# Patient Record
Sex: Female | Born: 1950 | Race: White | Hispanic: No | Marital: Married | Smoking: Former smoker
Health system: Southern US, Community
[De-identification: ages and names within clinical notes are randomized; demographics above are authoritative.]

## PROBLEM LIST (undated history)

## (undated) DIAGNOSIS — S62109A Fracture of unspecified carpal bone, unspecified wrist, initial encounter for closed fracture: Secondary | ICD-10-CM

## (undated) DIAGNOSIS — J449 Chronic obstructive pulmonary disease, unspecified: Secondary | ICD-10-CM

## (undated) DIAGNOSIS — K219 Gastro-esophageal reflux disease without esophagitis: Secondary | ICD-10-CM

## (undated) DIAGNOSIS — B9681 Helicobacter pylori [H. pylori] as the cause of diseases classified elsewhere: Secondary | ICD-10-CM

## (undated) DIAGNOSIS — K279 Peptic ulcer, site unspecified, unspecified as acute or chronic, without hemorrhage or perforation: Secondary | ICD-10-CM

## (undated) DIAGNOSIS — J309 Allergic rhinitis, unspecified: Secondary | ICD-10-CM

## (undated) DIAGNOSIS — M722 Plantar fascial fibromatosis: Secondary | ICD-10-CM

## (undated) DIAGNOSIS — A630 Anogenital (venereal) warts: Secondary | ICD-10-CM

## (undated) DIAGNOSIS — T7840XA Allergy, unspecified, initial encounter: Secondary | ICD-10-CM

## (undated) DIAGNOSIS — K2950 Unspecified chronic gastritis without bleeding: Secondary | ICD-10-CM

## (undated) DIAGNOSIS — K589 Irritable bowel syndrome without diarrhea: Secondary | ICD-10-CM

## (undated) DIAGNOSIS — K2970 Gastritis, unspecified, without bleeding: Secondary | ICD-10-CM

## (undated) DIAGNOSIS — D126 Benign neoplasm of colon, unspecified: Secondary | ICD-10-CM

## (undated) DIAGNOSIS — A048 Other specified bacterial intestinal infections: Secondary | ICD-10-CM

## (undated) DIAGNOSIS — L309 Dermatitis, unspecified: Secondary | ICD-10-CM

## (undated) DIAGNOSIS — K635 Polyp of colon: Secondary | ICD-10-CM

## (undated) HISTORY — DX: Peptic ulcer, site unspecified, unspecified as acute or chronic, without hemorrhage or perforation: K27.9

## (undated) HISTORY — DX: Gastro-esophageal reflux disease without esophagitis: K21.9

## (undated) HISTORY — PX: ESOPHAGOGASTRODUODENOSCOPY: SHX1529

## (undated) HISTORY — DX: Fracture of unspecified carpal bone, unspecified wrist, initial encounter for closed fracture: S62.109A

## (undated) HISTORY — DX: Benign neoplasm of colon, unspecified: D12.6

## (undated) HISTORY — DX: Dermatitis, unspecified: L30.9

## (undated) HISTORY — DX: Chronic obstructive pulmonary disease, unspecified: J44.9

## (undated) HISTORY — DX: Gastritis, unspecified, without bleeding: K29.70

## (undated) HISTORY — PX: POLYPECTOMY: SHX149

## (undated) HISTORY — DX: Allergy, unspecified, initial encounter: T78.40XA

## (undated) HISTORY — DX: Unspecified chronic gastritis without bleeding: K29.50

## (undated) HISTORY — DX: Anogenital (venereal) warts: A63.0

## (undated) HISTORY — DX: Helicobacter pylori (H. pylori) as the cause of diseases classified elsewhere: B96.81

## (undated) HISTORY — PX: COLONOSCOPY: SHX174

## (undated) HISTORY — DX: Other specified bacterial intestinal infections: A04.8

## (undated) HISTORY — DX: Plantar fascial fibromatosis: M72.2

## (undated) HISTORY — DX: Irritable bowel syndrome, unspecified: K58.9

## (undated) HISTORY — DX: Allergic rhinitis, unspecified: J30.9

## (undated) HISTORY — DX: Polyp of colon: K63.5

---

## 1972-04-09 HISTORY — PX: TOTAL ABDOMINAL HYSTERECTOMY W/ BILATERAL SALPINGOOPHORECTOMY: SHX83

## 1993-04-09 DIAGNOSIS — A630 Anogenital (venereal) warts: Secondary | ICD-10-CM

## 1993-04-09 HISTORY — DX: Anogenital (venereal) warts: A63.0

## 1994-04-09 HISTORY — PX: CERVICAL DISCECTOMY: SHX98

## 2003-02-25 ENCOUNTER — Encounter: Admission: RE | Admit: 2003-02-25 | Discharge: 2003-02-25 | Payer: Self-pay | Admitting: Internal Medicine

## 2003-03-16 ENCOUNTER — Encounter: Payer: Self-pay | Admitting: Internal Medicine

## 2004-02-10 ENCOUNTER — Ambulatory Visit: Payer: Self-pay | Admitting: Internal Medicine

## 2004-04-09 DIAGNOSIS — D126 Benign neoplasm of colon, unspecified: Secondary | ICD-10-CM

## 2004-04-09 HISTORY — DX: Benign neoplasm of colon, unspecified: D12.6

## 2004-04-24 ENCOUNTER — Ambulatory Visit: Payer: Self-pay | Admitting: Internal Medicine

## 2004-05-08 ENCOUNTER — Ambulatory Visit: Payer: Self-pay | Admitting: Internal Medicine

## 2005-02-12 ENCOUNTER — Ambulatory Visit: Payer: Self-pay | Admitting: Internal Medicine

## 2005-06-04 ENCOUNTER — Encounter: Admission: RE | Admit: 2005-06-04 | Discharge: 2005-06-04 | Payer: Self-pay | Admitting: Internal Medicine

## 2005-07-30 ENCOUNTER — Ambulatory Visit: Payer: Self-pay | Admitting: Internal Medicine

## 2006-01-04 ENCOUNTER — Other Ambulatory Visit: Admission: RE | Admit: 2006-01-04 | Discharge: 2006-01-04 | Payer: Self-pay | Admitting: Obstetrics & Gynecology

## 2006-06-10 ENCOUNTER — Encounter: Admission: RE | Admit: 2006-06-10 | Discharge: 2006-06-10 | Payer: Self-pay | Admitting: Internal Medicine

## 2006-10-28 ENCOUNTER — Ambulatory Visit: Payer: Self-pay | Admitting: Internal Medicine

## 2007-02-12 ENCOUNTER — Ambulatory Visit: Payer: Self-pay | Admitting: Internal Medicine

## 2007-02-12 DIAGNOSIS — K219 Gastro-esophageal reflux disease without esophagitis: Secondary | ICD-10-CM | POA: Insufficient documentation

## 2007-02-12 DIAGNOSIS — J309 Allergic rhinitis, unspecified: Secondary | ICD-10-CM | POA: Insufficient documentation

## 2007-02-12 DIAGNOSIS — K589 Irritable bowel syndrome without diarrhea: Secondary | ICD-10-CM | POA: Insufficient documentation

## 2007-02-12 DIAGNOSIS — E8941 Symptomatic postprocedural ovarian failure: Secondary | ICD-10-CM | POA: Insufficient documentation

## 2007-11-24 ENCOUNTER — Ambulatory Visit: Payer: Self-pay | Admitting: Internal Medicine

## 2007-12-08 ENCOUNTER — Encounter: Payer: Self-pay | Admitting: Internal Medicine

## 2007-12-08 ENCOUNTER — Ambulatory Visit: Payer: Self-pay | Admitting: Internal Medicine

## 2007-12-11 ENCOUNTER — Encounter: Payer: Self-pay | Admitting: Internal Medicine

## 2008-10-06 ENCOUNTER — Encounter: Admission: RE | Admit: 2008-10-06 | Discharge: 2008-10-06 | Payer: Self-pay | Admitting: Internal Medicine

## 2008-11-15 ENCOUNTER — Encounter (INDEPENDENT_AMBULATORY_CARE_PROVIDER_SITE_OTHER): Payer: Self-pay | Admitting: *Deleted

## 2009-11-01 ENCOUNTER — Encounter (INDEPENDENT_AMBULATORY_CARE_PROVIDER_SITE_OTHER): Payer: Self-pay | Admitting: *Deleted

## 2009-12-08 LAB — HM PAP SMEAR: HM Pap smear: NEGATIVE

## 2009-12-08 LAB — HM DEXA SCAN

## 2009-12-20 ENCOUNTER — Encounter: Admission: RE | Admit: 2009-12-20 | Discharge: 2009-12-20 | Payer: Self-pay | Admitting: Obstetrics & Gynecology

## 2009-12-26 ENCOUNTER — Encounter (INDEPENDENT_AMBULATORY_CARE_PROVIDER_SITE_OTHER): Payer: Self-pay | Admitting: *Deleted

## 2009-12-26 ENCOUNTER — Ambulatory Visit: Payer: Self-pay | Admitting: Internal Medicine

## 2009-12-26 DIAGNOSIS — Z860100 Personal history of colon polyps, unspecified: Secondary | ICD-10-CM | POA: Insufficient documentation

## 2009-12-26 DIAGNOSIS — R1013 Epigastric pain: Secondary | ICD-10-CM | POA: Insufficient documentation

## 2009-12-26 DIAGNOSIS — Z8719 Personal history of other diseases of the digestive system: Secondary | ICD-10-CM | POA: Insufficient documentation

## 2009-12-26 HISTORY — DX: Personal history of colon polyps, unspecified: Z86.0100

## 2009-12-26 HISTORY — DX: Personal history of colonic polyps: Z86.010

## 2010-01-09 ENCOUNTER — Ambulatory Visit: Payer: Self-pay | Admitting: Internal Medicine

## 2010-01-12 ENCOUNTER — Encounter: Payer: Self-pay | Admitting: Internal Medicine

## 2010-02-09 ENCOUNTER — Encounter (INDEPENDENT_AMBULATORY_CARE_PROVIDER_SITE_OTHER): Payer: Self-pay | Admitting: *Deleted

## 2010-04-24 ENCOUNTER — Telehealth: Payer: Self-pay | Admitting: Internal Medicine

## 2010-05-07 LAB — CONVERTED CEMR LAB
ALT: 14 units/L (ref 0–35)
AST: 18 units/L (ref 0–37)
Albumin: 3.7 g/dL (ref 3.5–5.2)
Alkaline Phosphatase: 69 units/L (ref 39–117)
BUN: 12 mg/dL (ref 6–23)
Basophils Absolute: 0.1 10*3/uL (ref 0.0–0.1)
Basophils Relative: 0.8 % (ref 0.0–1.0)
Bilirubin, Direct: 0.1 mg/dL (ref 0.0–0.3)
CO2: 30 meq/L (ref 19–32)
Calcium: 9.3 mg/dL (ref 8.4–10.5)
Chloride: 104 meq/L (ref 96–112)
Cholesterol: 202 mg/dL (ref 0–200)
Creatinine, Ser: 0.7 mg/dL (ref 0.4–1.2)
Direct LDL: 115.5 mg/dL
Eosinophils Absolute: 0.2 10*3/uL (ref 0.0–0.6)
Eosinophils Relative: 2.1 % (ref 0.0–5.0)
GFR calc Af Amer: 112 mL/min
GFR calc non Af Amer: 92 mL/min
Glucose, Bld: 91 mg/dL (ref 70–99)
HCT: 43.9 % (ref 36.0–46.0)
HDL: 60.7 mg/dL (ref >39.0)
Hemoglobin: 15 g/dL (ref 12.0–15.0)
Lymphocytes Relative: 29.3 % (ref 12.0–46.0)
MCHC: 34.1 g/dL (ref 30.0–36.0)
MCV: 97.8 fL (ref 78.0–100.0)
Monocytes Absolute: 0.6 10*3/uL (ref 0.2–0.7)
Monocytes Relative: 6.4 % (ref 3.0–11.0)
Neutro Abs: 5.2 10*3/uL (ref 1.4–7.7)
Neutrophils Relative %: 61.4 % (ref 43.0–77.0)
Pap Smear: NORMAL
Platelets: 216 10*3/uL (ref 150–400)
Potassium: 3.8 meq/L (ref 3.5–5.1)
RBC: 4.49 M/uL (ref 3.87–5.11)
RDW: 12.7 % (ref 11.5–14.6)
Sodium: 141 meq/L (ref 135–145)
TSH: 2.02 microintl units/mL (ref 0.35–5.50)
Total Bilirubin: 0.8 mg/dL (ref 0.3–1.2)
Total CHOL/HDL Ratio: 3.3
Total Protein: 7.4 g/dL (ref 6.0–8.3)
Triglycerides: 79 mg/dL (ref 0–149)
VLDL: 16 mg/dL (ref 0–40)
WBC: 8.6 10*3/uL (ref 4.5–10.5)

## 2010-05-09 NOTE — Miscellaneous (Signed)
Summary: GI PV  Clinical Lists Changes  Medications: Added new medication of DULCOLAX 5 MG  TBEC (BISACODYL) Day before procedure take 2 at 3pm and 2 at 8pm. - Signed Added new medication of METOCLOPRAMIDE HCL 10 MG  TABS (METOCLOPRAMIDE HCL) As per prep instructions. - Signed Added new medication of MIRALAX   POWD (POLYETHYLENE GLYCOL 3350) As per prep  instructions. - Signed Rx of DULCOLAX 5 MG  TBEC (BISACODYL) Day before procedure take 2 at 3pm and 2 at 8pm.;  #4 x 0;  Signed;  Entered by: Barton Fanny RN;  Authorized by: Iva Boop MD;  Method used: Electronic Rx of METOCLOPRAMIDE HCL 10 MG  TABS (METOCLOPRAMIDE HCL) As per prep instructions.;  #2 x 0;  Signed;  Entered by: Barton Fanny RN;  Authorized by: Iva Boop MD;  Method used: Electronic Rx of MIRALAX   POWD (POLYETHYLENE GLYCOL 3350) As per prep  instructions.;  #255gm x 0;  Signed;  Entered by: Barton Fanny RN;  Authorized by: Iva Boop MD;  Method used: Electronic Observations: Added new observation of NKA: T (11/24/2007 9:51)    Prescriptions: MIRALAX   POWD (POLYETHYLENE GLYCOL 3350) As per prep  instructions.  #255gm x 0   Entered by:   Barton Fanny RN   Authorized by:   Iva Boop MD   Signed by:   Barton Fanny RN on 11/24/2007   Method used:   Electronically sent to ...       [facility]  [address] [id number]       [address]       [place], [place]  [address]       Ph: [phone] or [phone]       Fax: [phone]   RxID:   [id number] METOCLOPRAMIDE HCL 10 MG  TABS (METOCLOPRAMIDE HCL) As per prep instructions.  #2 x 0   Entered by:   Barton Fanny RN   Authorized by:   Iva Boop MD   Signed by:   Barton Fanny RN on 11/24/2007   Method used:   Electronically sent to ...       [facility]  [address]*       [address]       [place], [place]  [address]       Ph: [phone] or [phone]       Fax: [phone]  RxID:   [id number] DULCOLAX 5 MG  TBEC (BISACODYL) Day before procedure take 2 at 3pm and 2 at 8pm.  #4 x 0   Entered by:   Barton Fanny RN   Authorized by:   Iva Boop MD   Signed by:   Barton Fanny RN on 11/24/2007   Method used:   Electronically sent to ...       [facility]  [address]       [address]       [place], [place]  [address]       Ph: [phone] or [phone]       Fax: [phone]   RxID:   [id number]

## 2010-05-09 NOTE — Letter (Signed)
Summary: Patient Notice- Polyp Results  [facility]  [address] [place], [place] [address]   Phone: [phone]  Fax: [phone]        December 11, 2007 MRN: [id number]    Jacqueline Boone [address] [place], [place]  [address]    Dear [name],  I am pleased to inform you that the colon polyps removed and/or biopsied during your recent colonoscopy were found to be benign (no cancer detected) upon pathologic examination.  I recommend you have a repeat colonoscopy examination in 1 year to look for recurrent polyps, and to reevaluate the polyps that were biopsied because they were too flat to remove. These polyps were a type known as hyperplastic, which typically do not have potential to develop into cancer over the years. It may be that these polyps were related to the scope sliding over your bowel and were essentially a temporary situation.  Should you develop new or worsening symptoms of abdominal pain, bowel habit changes or bleeding from the rectum or bowels, please schedule an evaluation with either your primary care physician or with me.  Please call us if you are having persistent problems or have questions about your condition that have not been fully answered at this time.  Sincerely,  Iva Boop MD  This letter has been electronically signed by your physician.

## 2010-05-09 NOTE — Procedures (Signed)
Summary: Upper Endoscopy  Patient: Jacqueline Boone Note: All result statuses are Final unless otherwise noted.  Tests: (1) Upper Endoscopy (EGD)   EGD Upper Endoscopy       DONE     [facility]     [address]     [place], [place]  [address]           ENDOSCOPY PROCEDURE REPORT           PATIENT:  [name], [name]  MR#:  [id number]     BIRTHDATE:  July 28, 1950, 58 yrs. old  GENDER:  female           ENDOSCOPIST:  Iva Boop, MD, [facility]           PROCEDURE DATE:  01/09/2010     PROCEDURE:  EGD with biopsy     ASA CLASS:  Class I     INDICATIONS:  epigastric pain           MEDICATIONS:   Fentanyl 75 mcg IV, Versed 7 mg IV     TOPICAL ANESTHETIC:  Exactacain Spray           DESCRIPTION OF PROCEDURE:   After the risks benefits and     alternatives of the procedure were thoroughly explained, informed     consent was obtained.  The [facility] [id number] endoscope was     introduced through the mouth and advanced to the second portion of     the duodenum, without limitations.  The instrument was slowly     withdrawn as the mucosa was fully examined.     <<PROCEDUREIMAGES>>           Multiple erosions were found in the antrum. Multiple biopsies were     obtained and sent to pathology.  Multiple erosions were found in     the bulb of the duodenum.  Moderate gastritis was found in the     body and the antrum of the stomach.  Otherwise the examination was     normal.    Retroflexed views revealed no abnormalities.    The     scope was then withdrawn from the patient and the procedure     completed.           COMPLICATIONS:  None           ENDOSCOPIC IMPRESSION:     1) Erosions, multiple in the antrum     2) Erosions, multiple in the bulb of duodenum     3) Moderate gastritis in the body and the antrum of the stomach           4) Otherwise normal examination     RECOMMENDATIONS:     Start omeprazole 40 mg each AM. Do not stop unless instructed by     health care  provider.     Await biopsies - will notify           Iva Boop, MD, [facility]           CC:  Willow Ora, MD     The Patient           n.     eSIGNED:   Iva Boop at 01/09/2010 04:03 PM           Lorel, [place], [id number]  Note: An exclamation mark (!) indicates a result that was not dispersed into the flowsheet. Document Creation Date: 01/09/2010 4:03 PM _______________________________________________________________________  (1) Order result status: Final  Collection or observation date-time: 01/09/2010 15:19 Requested date-time:  Receipt date-time:  Reported date-time:  Referring Physician:   Ordering Physician: Stan Head [phone]) Specimen Source:  Source: Launa Grill Order Number: [id number] Lab site:

## 2010-05-09 NOTE — Letter (Signed)
Summary: New Patient letter  [facility]  [address] [place], [place] [address]   Phone: [phone]  Fax: [phone]       11/01/2009 MRN: [id number]  Jacqueline Boone [address] [place], [place]  [address]  Dear Jacqueline Boone,  Welcome to the Gastroenterology Division at [facility].    You are scheduled to see Dr. Leone Payor on 9/19/2011at 8:45AM on the 3rd floor at [facility], [address]  We ask that you try to arrive at our office 15 minutes prior to your appointment time to allow for check-in.  We would like you to complete the enclosed self-administered evaluation form prior to your visit and bring it with you on the day of your appointment.  We will review it with you.  Also, please bring a complete list of all your medications or, if you prefer, bring the medication bottles and we will list them.  Please bring your insurance card so that we may make a copy of it.  If your insurance requires a referral to see a specialist, please bring your referral form from your primary care physician.  Co-payments are due at the time of your visit and may be paid by cash, check or credit card.     Your office visit will consist of a consult with your physician (includes a physical exam), any laboratory testing he/she may order, scheduling of any necessary diagnostic testing (e.g. x-ray, ultrasound, CT-scan), and scheduling of a procedure (e.g. Endoscopy, Colonoscopy) if required.  Please allow enough time on your schedule to allow for any/all of these possibilities.    If you cannot keep your appointment, please call [phone] to cancel or reschedule prior to your appointment date.  This allows [place] the opportunity to schedule an appointment for another patient in need of care.  If you do not cancel or reschedule by 5 p.m. the business day prior to your appointment date, you will be charged a $50.00 late cancellation/no-show fee.    Thank you for choosing  [facility] for your medical needs.  We appreciate the opportunity to care for you.  Please visit [place] at our website  to learn more about our practice.                     Sincerely,                                                             The Gastroenterology Division

## 2010-05-09 NOTE — Procedures (Signed)
Summary: Colonoscopy  Patient: Jacqueline Boone Note: All result statuses are Final unless otherwise noted.  Tests: (1) Colonoscopy (COL)   COL Colonoscopy           DONE     [facility]     [address]     [place], [place]  [address]           COLONOSCOPY PROCEDURE REPORT           PATIENT:  Jacqueline Boone, Jacqueline Boone  MR#:  [id number]     BIRTHDATE:  11-Jan-1951, 58 yrs. old  GENDER:  female     ENDOSCOPIST:  Iva Boop, MD, [facility]           PROCEDURE DATE:  01/09/2010     PROCEDURE:  Colonoscopy with snare polypectomy     ASA CLASS:  Class I     INDICATIONS:  surveillance and high-risk screening, history of     pre-cancerous (adenomatous) colon polyps She has had a mixture of     hyperplastic and adenomatous polyps, in 2005 and then 2009. For     follow-up as not all removed in 2009 (technically unable).     MEDICATIONS:   Fentanyl 25 mcg IV, Versed 3 mg IV, There was     residual sedation effect present from prior procedure.           DESCRIPTION OF PROCEDURE:   After the risks benefits and     alternatives of the procedure were thoroughly explained, informed     consent was obtained.  Digital rectal exam was performed and     revealed no abnormalities.   The LB [id number] endoscope     was introduced through the anus and advanced to the cecum, which     was identified by both the appendix and ileocecal valve, without     limitations.  The quality of the prep was excellent, using     MoviPrep.  The instrument was then slowly withdrawn as the colon     was fully examined. Insertion: 2:30 minutes Withdrawal: 28:50     minutes.     <<PROCEDUREIMAGES>>           FINDINGS:  Five polyps were found. Proximal splenic flexure,     distal splenic flexure (5-6 mm polyp in region of larger polyp     partially removed/biopsied 2009), descending, sigmoid and rectum.     Minimum size 6 mm, maximum 1 cm. All sessile. Some polyps were     snared without cautery and some  were snared, then cauterized with     monopolar cautery. Retrieval was successful. This was otherwise a     normal examination of the colon.   Retroflexed views in the right     colon and rectum revealed no abnormalities.    The scope was then     withdrawn from the patient and the procedure completed.           COMPLICATIONS:  None     ENDOSCOPIC IMPRESSION:     1) Five polyps removed, largest 1 cm and smallest 5-6 mm. all     flat, sessile polyps in left colon/rectum.     2) Otherwise normal examination     RECOMMENDATIONS:     1) No aspirin or NSAID's for 2 weeks           REPEAT EXAM:  In for Colonoscopy, pending biopsy results.  Iva Boop, MD, [facility]           CC:  The Patient     Willow Ora, MD           n.     eSIGNED:   Iva Boop at 01/09/2010 04:13 PM           T[place], Erich[place], [id number]  Note: An exclamation mark (!) indicates a result that was not dispersed into the flowsheet. Document Creation Date: 01/09/2010 4:13 PM _______________________________________________________________________  (1) Order result status: Final Collection or observation date-time: 01/09/2010 15:55 Requested date-time:  Receipt date-time:  Reported date-time:  Referring Physician:   Ordering Physician: Stan Head [phone]) Specimen Source:  Source: Launa Grill Order Number: [id number] Lab site:   Appended Document: Colonoscopy     Procedures Next Due Date:    Colonoscopy: 01/2013

## 2010-05-09 NOTE — Miscellaneous (Signed)
Summary: Omeprazole Rx  Clinical Lists Changes  Medications: Removed medication of MOVIPREP 100 GM  SOLR (PEG-KCL-NACL-NASULF-NA ASC-C) As per prep instructions. Added new medication of OMEPRAZOLE 40 MG CPDR (OMEPRAZOLE) 1 by mouth once daily 30-60 minutes before breakfast - Signed Rx of OMEPRAZOLE 40 MG CPDR (OMEPRAZOLE) 1 by mouth once daily 30-60 minutes before breakfast;  #30 x 11;  Signed;  Entered by: Iva Boop MD, [facility];  Authorized by: Iva Boop MD, FACG;  Method used: Electronically to [facility] # [id number]*, [address] [place], [place], [place]  [address], Ph: [phone] or [phone], Fax: [phone]    Prescriptions: OMEPRAZOLE 40 MG CPDR (OMEPRAZOLE) 1 by mouth once daily 30-60 minutes before breakfast  #30 x 11   Entered and Authorized by:   Iva Boop MD, [facility]   Signed by:   Iva Boop MD, [facility] on 01/09/2010   Method used:   Electronically to        [facility]  [address]* (retail)       [address]       [place], [place]  [address]       Ph: [phone] or [phone]       Fax: [phone]   RxID:   [id number]

## 2010-05-09 NOTE — Letter (Signed)
Summary: Patient Notice- Polyp Results  [facility]  [address] [place], [place] [address]   Phone: [phone]  Fax: [phone]        January 12, 2010 MRN: [id number]    Jacqueline Boone [address] [place], [place]  [address]    Dear [name],  Some of the polyps removed from your colon were adenomatous. This means that they were pre-cancerous or that  they had the potential to change into cancer over time.   I recommend that you have a repeat colonoscopy in 3 years to determine if you have developed any new polyps over time and to screen for colorectal cancer. If you develop any new rectal bleeding, abdominal pain or significant bowel habit changes, please contact us before then.  The stomach biopsies showed non-specific inflammation and I am hopeful that the omeprazole will treat that and your symptoms effectively. Some of it may have been due to ibuprofen use also.  Please call us if you are having persistent problems or have questions about your condition that have not been fully answered at this time.   Sincerely,  Iva Boop MD, [facility]  This letter has been electronically signed by your physician.  Appended Document: Patient Notice- Polyp Results letter mailed

## 2010-05-09 NOTE — Assessment & Plan Note (Signed)
Summary: ABD PAIN,POSS ULCER...AS.    History of Present Illness Visit Type: Initial Visit Primary GI MD: Stan Head MD [facility] Primary Provider: Willow Ora, MD Chief Complaint: Abdominal pain History of Present Illness:   Pateint states that she has the same symptoms experienced when she had H pylori previously, same area.  Having bloating, dull pain in the epigastric area. Intermittent. when it is there "it is bad and bothers me for days". She believes it has been an intermittent issue since H. pylori gastritis was diagnosed at last EGD. Now worse in frequency and intensity. It does not awaken her.  Constipation with 2 bowel movements a week is not changed.   GI Review of Systems    Reports abdominal pain, acid reflux, belching, and  bloating.     Location of  Abdominal pain: epigastric area.    Denies chest pain, dysphagia with liquids, dysphagia with solids, heartburn, loss of appetite, nausea, vomiting, vomiting blood, weight loss, and  weight gain.      Reports constipation.     Denies anal fissure, black tarry stools, change in bowel habit, diarrhea, diverticulosis, fecal incontinence, heme positive stool, hemorrhoids, irritable bowel syndrome, jaundice, light color stool, liver problems, rectal bleeding, and  rectal pain. Clinical Reports Reviewed:  Colonoscopy:  12/08/2007:  Pathology:  Adenomatous polyp x 1 Pathology:  Hyperplastic polyp x 3 Location:  [facility].   Comments: 1) FOUR POLYPS, 2 SMALLER ONES REMOVED, LARGER FLATTER POLYPS BIOPSIED ONLY. THESE HAVE APPEARANCE OF HYPERPLASTIC POLYPS BUT AWAIT PATHOLOGY. 2) OTHERWISE NORMAL WITH EXCELLENT PREP. 3) PRIOR LARGE HYPERPLASTIC CECAL POLYP 2005  ***MICROSCOPIC EXAMINATION AND DIAGNOSIS***    1. COLON, SPLENIC FLEXURE POLYP: FRAGMENTS OF HYPERPLASTIC POLYP. NO ADENOMATOUS CHANGE OR MALIGNANCY IDENTIFIED.    2. COLON, SIGMOID POLYP: TUBULAR ADENOMA. NO HIGH GRADE DYSPLASIA OR MALIGNANCY  IDENTIFIED.    3. COLON, SIGMOID POLYP: FRAGMENTS OF HYPERPLASTIC POLYP. NO ADENOMATOUS CHANGE OR MALIGNANCY IDENTIFIED.    4. COLON, RECTAL POLYP: HYPERPLASTIC POLYP. NO ADENOMATOUS CHANGE OR MALIGNANCY IDENTIFIED.  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 -FOCAL ULCERATION ASSOCIATED WITH FIBROSIS AND REGENERATIVE/REPARATIVE EPITHERLIAL CHANGES. -NO INTESTINAL METAPLAISA OR MALIGNANCY IDENTIFIED.   Preventive Screening-Counseling & Management  Alcohol-Tobacco     Smoking Status: quit  Caffeine-Diet-Exercise     Caffeine use/day: 3/day coffeee     Caffeine Counseling: decrease use of caffeine     Does Patient Exercise: yes     Type of exercise: walk     Exercise (avg: min/session): 45     Times/week: 1-2     Exercise Counseling: to improve exercise regimen      Drug Use:  no.      Current Medications (verified): 1)  Premarin 0.625 Mg  Tabs (Estrogens Conjugated) .... Take 1 Tab Daily  Allergies (verified): No Known Drug Allergies  Past History:  Past Medical History: Allergic rhinitis GERD Peptic Ulcer Disease H. pylori gastritis Adenomatous Colon Polyps  Past Surgical History: Reviewed history from 12/23/2009 and no changes required. Hysterectomy 1974 OophorectomyC spine diskektomy , anteriorly, C4-5 (1998)  Family History: Reviewed history from 02/12/2007 and no changes required. Hodgkins lymphoma: GF at 39y/o and father  at 79y/o Breast ca--no MI--no Colon ca--no  Social History: Married no kids Patient is a former smoker.  Alcohol Use - no Daily Caffeine Use Illicit  Drug Use - no Works part-time at [employer] Smoking Status:  quit Caffeine use/day:  3/day coffeee Does Patient Exercise:  yes  Review  of Systems       The patient complains of allergy/sinus, night sweats, and urine leakage.         All other ROS negative except as per HPI.   Vital Signs:  Patient profile:   60 year old female Height:      63.5 inches Weight:      146.50 pounds BMI:     25.64 Pulse rate:   72 / minute Pulse rhythm:   regular BP sitting:   90 / 60  (left arm) Cuff size:   regular  Vitals Entered By: June McMurray CMA Duncan Dull) (December 26, 2009 8:49 AM)  Physical Exam  General:  Well developed, well nourished, no acute distress. Eyes:  PERRLA, no icterus. Mouth:  No deformity or lesions, prior dental work and missing some teeth. Neck:  Supple; no masses or thyromegaly. Lungs:  Clear throughout to auscultation. Heart:  Regular rate and rhythm; no murmurs, rubs,  or bruits. Abdomen:  Soft, nontender and nondistended. No masses, hepatosplenomegaly or hernias noted. Normal bowel sounds. Rectal:  deferred until time of colonoscopy.   Extremities:  No clubbing, cyanosis, edema or deformities noted. Neurologic:  Alert and  oriented x4;  grossly normal neurologically. Cervical Nodes:  No significant cervical or supraclavicular adenopathy.  Inguinal Nodes:  No significant inguinal adenopathy. Psych:  Alert and cooperative. Normal mood and affect.   Impression & Recommendations:  Problem # 1:  ABDOMINAL PAIN-EPIGASTRIC (ICD-789.06) Evaluate this recurrent problem with EGD to look for gastritis, ulcer disease and recurrent H. pylori. She is off PPI til then. Orders: Colon/Endo (Colon/Endo) Risks, benefits,and indications of endoscopic procedure(s) were reviewed with the patient and all questions answered.  Problem # 2:  HELICOBACTER PYLORI GASTRITIS, HX OF (ICD-V12.79) Assessment: Unchanged reassess with EGD and bx  Problem # 3:  COLONIC POLYPS, ADENOMATOUS, HX OF (ICD-V12.72) due for surveillance and screening - I had recommended closer follow-up as she has had numerous hyperplastic polyps as  well as some adenomas Orders: Colon/Endo (Colon/Endo) Risks, benefits,and indications of endoscopic procedure(s) were reviewed with the patient and all questions answered.  Problem # 4:  IRRITABLE BOWEL SYNDROME (ICD-564.1) Assessment: Deteriorated may be the problem  Patient Instructions: 1)  Please pick up your medications at your pharmacy. MOVIPREP 2)  We will see you at your procedure on 01/09/10 3)   Endoscopy Center Patient Information Guide given to patient.  4)  Colonoscopy and Flexible Sigmoidoscopy brochure given.  5)  Upper Endoscopy brochure given.  6)  The medication list was reviewed and reconciled.  All changed / newly prescribed medications were explained.  A complete medication list was provided to the patient / caregiver. Prescriptions: MOVIPREP 100 GM  SOLR (PEG-KCL-NACL-NASULF-NA ASC-C) As per prep instructions.  #1 x 0   Entered by:   Francee Piccolo CMA (AAMA)   Authorized by:   Iva Boop MD, [facility]   Signed by:   Francee Piccolo CMA (AAMA) on 12/26/2009   Method used:   Electronically to        [facility] [address] [facility] (retail)       [address]       [place], [place]  [address]       Ph: [phone] or [phone]       Fax: [phone]  RxID:   [id number]

## 2010-05-09 NOTE — Procedures (Signed)
Summary: Colonoscopy   Colonoscopy  Procedure date:  12/08/2007  Findings:      Location:  [facility].    Procedures Next Due Date:    Colonoscopy: 12/2008  Patient Name: Jacqueline Boone MRN:  Procedure Procedures: Colonoscopy CPT: [phone].    with biopsy. CPT: Q5068410.    with polypectomy. CPT: [id number].    with Submucosal injection  Personnel: Endoscopist: Iva Boop, MD, [facility].  Exam Location: Exam performed in Outpatient Clinic. Outpatient  Patient Consent: Procedure, Alternatives, Risks and Benefits discussed, consent obtained, from patient. Consent was obtained by the RN. Indications  Surveillance of: Adenomatous Polyp(s). This is an initial surveillance exam. Initial polypectomy was performed in 2005. in Jun. 1-2 Polyps were found at Index Exam. Largest polyp removed was 10 to 19 mm. Prior polyp located in proximal (splenic flexure and beyond) colon. Pathology of worst  polyp: hyperplastic.  Comments: Large hyperplastic cecal polyp previously History  Current Medications: Patient is not currently taking Coumadin.  Allergies: No known allergies.  Pre-Exam Physical: Performed Dec 08, 2007. Cardio-pulmonary exam, Rectal exam, HEENT exam , Abdominal exam, Mental status exam WNL.  Comments: Pt. history reviewed/updated, physical exam performed prior to initiation of sedation? yes Exam Exam: Extent of exam reached: Cecum, extent intended: Cecum.  The cecum was identified by appendiceal orifice and IC valve. Patient position: on left side. Time to Cecum: 00:03:27. Time for Withdrawl: 00:30:43. Colon retroflexion performed. Images taken. ASA Classification: I. Tolerance: excellent.  Monitoring: Pulse and BP monitoring, Oximetry used. Supplemental O2 given.  Colon Prep Used MiraLax for colon prep. Prep results: excellent.  Sedation Meds: Patient assessed and found to be appropriate for moderate (conscious) sedation. Fentanyl 50 mcg. given  IV. Versed 3 mg. given IV. Findings POLYP: Splenic Flexure, Maximum size: 20 mm. sessile polyp. Procedure:  snare with cautery, not removed, Path # 1. Comments: Large flat polyp, submucosal saline injection would not persistently elevate it. One small piece snared, and biopsie taken. Site marked with submucosal SPOT in 4 areas proximal and distal to site.  NORMAL EXAM: Descending Colon.  - NORMAL EXAM: Cecum to Splenic Flexure.  POLYP: Sigmoid Colon, Maximum size: 5 mm. sessile polyp. Procedure:  biopsy without cautery, The polyp was removed piece meal. removed, retrieved, Polyp sent to pathology. Path # 2.  POLYP: Sigmoid Colon, Maximum size: 15 mm. sessile polyp. Procedure:  biopsy without cautery, not removed, Path # 3. Comments: Flat, polypoid changes.  POLYP: Rectum, Maximum size: 5 mm. sessile polyp. Procedure:  snare without cautery, removed, retrieved, sent to pathology. Path # 4.   Assessment  Comments: 1) FOUR POLYPS, 2 SMALLER ONES REMOVED, LARGER FLATTER POLYPS BIOPSIED ONLY. THESE HAVE APPEARANCE OF HYPERPLASTIC POLYPS BUT AWAIT PATHOLOGY. 2) OTHERWISE NORMAL WITH EXCELLENT PREP. 3) PRIOR LARGE HYPERPLASTIC CECAL POLYP 2005 Events  Unplanned Interventions: No intervention was required.  Plans Medication Plan: Await pathology.  Disposition: After procedure patient sent to recovery. After recovery patient sent home.  Scheduling/Referral: Await pathology to schedule patient.  Comments: IF ALL HYPERPLASTIC THEN COMPLETE REMOVAL OF RESIDUAL POLYPS MAY NOT BE NECESSARY BUT WILL NEED TO DETERMINE APPROPRATE SURVEILLANCE.    cc.   Jose Paz,MD   REPORT OF SURGICAL PATHOLOGY   Case #: [id number] Patient Name: Jacqueline Boone, Jacqueline Boone. Office Chart Number:  N/A   MRN: [id number] Pathologist: Beulah Gandy. Luisa Hart, MD DOB/Age  [birth year] (Age: 60)    Gender: F Date Taken:  12/08/2007 Date Received: 12/08/2007   FINAL DIAGNOSIS   ***MICROSCOPIC EXAMINATION  AND  DIAGNOSIS***   1.  COLON, SPLENIC FLEXURE POLYP:  FRAGMENTS OF HYPERPLASTIC POLYP.  NO ADENOMATOUS CHANGE OR MALIGNANCY IDENTIFIED.   2.  COLON, SIGMOID POLYP:  TUBULAR ADENOMA.  NO HIGH GRADE DYSPLASIA OR MALIGNANCY IDENTIFIED.   3.  COLON, SIGMOID POLYP:  FRAGMENTS OF HYPERPLASTIC POLYP.  NO ADENOMATOUS CHANGE OR MALIGNANCY IDENTIFIED.   4.  COLON, RECTAL POLYP:  HYPERPLASTIC POLYP.  NO ADENOMATOUS CHANGE OR MALIGNANCY IDENTIFIED.   kv Date Reported:  12/10/2007     Beulah Gandy. Luisa Hart, MD *** Electronically Signed Out By JDP ***   Clinical information HX polyps R/O adenoma (jes)    specimen(s) obtained 1: Colon, polyp(s), splenic flexure 2: Colon, polyp(s), proximal sigmoid 3: Colon, polyp(s), sigmoid 4: Rectum, polyp(s)   Gross Description 1.  Received in formalin are tan, soft tissue fragments that are submitted in toto.  Number:  three Size:  0.3 to 0.5 cm One block    2.  Received in formalin are tan, soft tissue fragments that are submitted in toto.   Number:  two Size:  0.1 to 0.4 cm One block    3.  Received in formalin are tan, soft tissue fragments that are submitted in toto.   Number:  four. Size:  0.2 to 0.3 cm One block    4.  Received in formalin is a tan, soft tissue fragment that is submitted in toto.  Size:  0.5 cm One block (TA:jes,12/09/07)     jes/     Signed by Iva Boop MD on 12/11/2007 at 6:30 AM  ________________________________________________________________________ only 1 small adenoma hyperplastic polyps otherwise recall 1 yr due to multiple left hyperplastic polyps and the adenoma, to see if these were persistent polyps or mucosl changes from scope passage 12/2008   Signed by Iva Boop MD on 12/11/2007 at 6:35 AM  ________________________________________________________________________   December 11, 2007 MRN: [id number]    Jacqueline Boone [address] [place], [place]  [address]    Dear [name],  I am  pleased to inform you that the colon polyps removed and/or biopsied during your recent colonoscopy were found to be benign (no cancer detected) upon pathologic examination.  I recommend you have a repeat colonoscopy examination in 1 year to look for recurrent polyps, and to reevaluate the polyps that were biopsied because they were too flat to remove. These polyps were a type known as hyperplastic, which typically do not have potential to develop into cancer over the years. It may be that these polyps were related to the scope sliding over your bowel and were essentially a temporary situation.  Should you develop new or worsening symptoms of abdominal pain, bowel habit changes or bleeding from the rectum or bowels, please schedule an evaluation with either your primary care physician or with me.  Please call us if you are having persistent problems or have questions about your condition that have not been fully answered at this time.  Sincerely,  Iva Boop MD  This letter has been electronically signed by your physician.   Signed by Iva Boop MD on 12/11/2007 at 6:39 AM  ________________________________________________________________________ ,gisig  Appended Document: Colonoscopy  Colonoscopy  Procedure date:  12/08/2007  Findings:      Pathology:  Adenomatous polyp x 1 Pathology:  Hyperplastic polyp x 3 Location:  [facility].   Comments: 1) FOUR POLYPS, 2 SMALLER ONES REMOVED, LARGER FLATTER POLYPS BIOPSIED ONLY. THESE HAVE APPEARANCE OF HYPERPLASTIC POLYPS BUT AWAIT  PATHOLOGY. 2) OTHERWISE NORMAL WITH EXCELLENT PREP. 3) PRIOR LARGE HYPERPLASTIC CECAL POLYP 2005  ***MICROSCOPIC EXAMINATION AND DIAGNOSIS***    1. COLON, SPLENIC FLEXURE POLYP: FRAGMENTS OF HYPERPLASTIC POLYP. NO ADENOMATOUS CHANGE OR MALIGNANCY IDENTIFIED.    2. COLON, SIGMOID POLYP: TUBULAR ADENOMA. NO HIGH GRADE DYSPLASIA OR MALIGNANCY IDENTIFIED.    3. COLON, SIGMOID POLYP:  FRAGMENTS OF HYPERPLASTIC POLYP. NO ADENOMATOUS CHANGE OR MALIGNANCY IDENTIFIED.    4. COLON, RECTAL POLYP: HYPERPLASTIC POLYP. NO ADENOMATOUS CHANGE OR MALIGNANCY IDENTIFIED.   Procedures Next Due Date:    Colonoscopy: 12/2008

## 2010-05-09 NOTE — Letter (Signed)
Summary: Recall Colonoscopy Letter  [facility]  [address] [place], [place] [address]   Phone: [phone]  Fax: [phone]      November 15, 2008 MRN: [id number]   Jacqueline Boone [address] [place], [place]  [address]   Dear [name],   According to your medical record, it is time for you to schedule a Colonoscopy. The American Cancer Society recommends this procedure as a method to detect early colon cancer. Patients with a family history of colon cancer, or a personal history of colon polyps or inflammatory bowel disease are at increased risk.  This letter has beeen generated based on the recommendations made at the time of your procedure. If you feel that in your particular situation this may no longer apply, please contact our office.  Please call our office at [phone] to schedule this appointment or to update your records at your earliest convenience.  Thank you for cooperating with [place] to provide you with the very best care possible.   Sincerely,  Iva Boop, M.D.  [facility] [phone]

## 2010-05-09 NOTE — Miscellaneous (Signed)
Summary: got @ [facility]   Immunization History:  Influenza Immunization History:    Influenza:  got @ [facility]  (02/06/2010)

## 2010-05-09 NOTE — Procedures (Signed)
Summary: EGD: Gastritis, Duodenitis, Ulcer, RUT +   EGD  Procedure date:  03/16/2003  Findings:      Findings: Duodenitis  Findings: Gastritis  Findings: Ulcer -- RUT positive Location: [facility]   ***MICROSCOPIC EXAMINATION AND DIAGNOSIS*** STOMACH, BIOPSY:  -CHRONIC ACTIVE GASTRITIS ASSOCIATED WITH NUMEROUS HELICOBACTER PYLORI ORGANISMS. -FOCAL ULCERATION ASSOCIATED WITH FIBROSIS AND REGENERATIVE/REPARATIVE EPITHERLIAL CHANGES. -NO INTESTINAL METAPLAISA OR MALIGNANCY IDENTIFIED.  Patient Name: Jacqueline Boone, Jacqueline Boone MRN:  Procedure Procedures: Panendoscopy (EGD) CPT: 43235.    with biopsy(s)/brushing(s). CPT: [id number].  Personnel: Endoscopist: Iva Boop, MD, [facility].  Exam Location: Exam performed in Outpatient Clinic. Outpatient  Patient Consent: Procedure, Alternatives, Risks and Benefits discussed, consent obtained, from patient. Consent was obtained by the RN.  Indications Symptoms: Dyspepsia,  History  Current Medications: Patient is not currently taking Coumadin.  Pre-Exam Physical: Performed Mar 16, 2003  Cardio-pulmonary exam, HEENT exam, Abdominal exam, Mental status exam WNL.  Exam Exam Info: Maximum depth of insertion Duodenum, intended Duodenum. Patient position: on left side. Vocal cords not visualized. Gastric retroflexion performed. Images taken. ASA Classification: I. Tolerance: excellent.  Sedation Meds: Patient assessed and found to be appropriate for moderate (conscious) sedation. Sedation was managed by the Endoscopist. Fentanyl 50 mcg. given IV. Versed 5 mg. given IV. Cetacaine Spray 2 sprays given aerosolized.  Monitoring: BP and pulse monitoring done. Oximetry used. Supplemental O2 given  Findings Normal: Proximal Esophagus to Distal Esophagus.  MUCOSAL ABNORMALITY: Fundus to Body. Mosaic/scaly mucosa. Red spots present. ICD9: Gastritis, Chronic: 535.10.  MUCOSAL ABNORMALITY: Antrum. Erosions present. Erythematous  mucosa. Edema present. ICD9: Gastritis without Hemorrhage: 535.50.  MUCOSAL ABNORMALITY: Duodenal Bulb. Erythematous mucosa. Edema present. RUT done, results pending. ICD9: Duodenitis without Hemorrhage: 535.60.  ULCER: in Antrum Minimum Size: 2 mm. Maximum size: 6 mm. Not bleeding, clear ulcer base. An image was taken. Biopsy/Ulcer taken. RUT done, results pending.  ICD9: Ulcer, Gastric, Acute without Hemorrhage: 531.30.  Normal: Duodenal Apex to Duodenal 2nd Portion.   Assessment Abnormal examination, see findings above.  Diagnoses: 535.10: Gastritis, Chronic.  531.30: Ulcer, Gastric, Acute without Hemorrhage.  535.50: Gastritis without Hemorrhage.  535.60: Duodenitis without Hemorrhage.   Events  Unplanned Intervention: No unplanned interventions were required.  Plans Comments: Treat H. pylori if positive. Start PPI (Nexium 40 mg each day). Disposition: After procedure patient sent to recovery. After recovery patient sent home.  Scheduling: Await pathology to schedule patient.  Comments: Will notify need to treat H. pylori. Did not qualify for dyspepsia study. May need follow-up visit with me in office.  This report was created from the original endoscopy report, which was reviewed and signed by the above listed endoscopist.

## 2010-05-09 NOTE — Letter (Signed)
Summary: [facility] Instructions  [facility]  [address] [place], [place] [address]   Phone: [phone]  Fax: [phone]       Jacqueline Boone    [birth year]    MRN: [id number]      Procedure Day Dorna Bloom: Duanne Limerick, 01/09/10     Arrival Time: 1:30 PM      Procedure Time: 2:30 PM    Location of Procedure:                    _X_  [facility] (4th Floor)  PREPARATION FOR COLONOSCOPY WITH MOVIPREP   Starting 5 days prior to your procedure 01/05/10 do not eat nuts, seeds, popcorn, corn, beans, peas,  salads, or any raw vegetables.  Do not take any fiber supplements (e.g. Metamucil, Citrucel, and Benefiber).  THE DAY BEFORE YOUR PROCEDURE         SUNDAY, 01/08/10  1.  Drink clear liquids the entire day-NO SOLID FOOD  2.  Do not drink anything colored red or purple.  Avoid juices with pulp.  No orange juice.  3.  Drink at least 64 oz. (8 glasses) of fluid/clear liquids during the day to prevent dehydration and help the prep work efficiently.  CLEAR LIQUIDS INCLUDE: Water Jello Ice Popsicles Tea (sugar ok, no milk/cream) Powdered fruit flavored drinks Coffee (sugar ok, no milk/cream) Gatorade Juice: apple, white grape, white cranberry  Lemonade Clear bullion, consomm, broth Carbonated beverages (any kind) Strained chicken noodle soup Hard Candy                           4.  In the morning, mix first dose of MoviPrep solution:    Empty 1 Pouch A and 1 Pouch B into the disposable container    Add lukewarm drinking water to the top line of the container. Mix to dissolve    Refrigerate (mixed solution should be used within 24 hrs)  5.  Begin drinking the prep at 5:00 p.m. The MoviPrep container is divided by 4 marks.   Every 15 minutes drink the solution down to the next mark (approximately 8 oz) until the full liter is complete.   6.  Follow completed prep with 16 oz of clear liquid of your choice (Nothing red or purple).  Continue to drink clear  liquids until bedtime.  7.  Before going to bed, mix second dose of MoviPrep solution:    Empty 1 Pouch A and 1 Pouch B into the disposable container    Add lukewarm drinking water to the top line of the container. Mix to dissolve    Refrigerate  THE DAY OF YOUR PROCEDURE      MONDAY, 01/09/10  Beginning at 9:30 a.m. (5 hours before procedure):         1. Every 15 minutes, drink the solution down to the next mark (approx 8 oz) until the full liter is complete.  2. Follow completed prep with 16 oz. of clear liquid of your choice.    3. You may drink clear liquids until 12:30 PM (2 HOURS BEFORE PROCEDURE).  MEDICATION INSTRUCTIONS  Unless otherwise instructed, you should take regular prescription medications with a small sip of water   as early as possible the morning of your procedure.       OTHER INSTRUCTIONS  You will need a responsible adult at least 60 years of age to accompany you and drive you home.   This  person must remain in the waiting room during your procedure.  Wear loose fitting clothing that is easily removed.  Leave jewelry and other valuables at home.  However, you may wish to bring a book to read or  an iPod/MP3 player to listen to music as you wait for your procedure to start.  Remove all body piercing jewelry and leave at home.  Total time from sign-in until discharge is approximately 2-3 hours.  You should go home directly after your procedure and rest.  You can resume normal activities the  day after your procedure.  The day of your procedure you should not:   Drive   Make legal decisions   Operate machinery   Drink alcohol   Return to work  You will receive specific instructions about eating, activities and medications before you leave.   The above instructions have been reviewed and explained to me by   _______________________   I fully understand and can verbalize these instructions _____________________________ Date _________

## 2010-05-11 NOTE — Progress Notes (Signed)
Summary: Triage   Phone Note Call from Patient Call back at Home Phone [phone]   Caller: Patient Call For: Dr. Leone Payor Reason for Call: Talk to Nurse Summary of Call: Omeprazole is causing her to have a headache, congested Initial call taken by: Karna Christmas,  April 24, 2010 1:54 PM  Follow-up for Phone Call        Patient stated that Nexium was giving her headaches and congestion. I told patient that I can leave samples of Nexium up front for her to pick up and try so she dont have to pay for another RX if it does not work. Told patient if Nexium does work call us back and we can send RX to pharmacy  Follow-up by: Ok Anis CMA,  April 24, 2010 2:57 PM     Appended Document: Triage Patient stated Omeprazole was giving her headaches. Left samples of Nexium up front

## 2010-07-28 ENCOUNTER — Other Ambulatory Visit: Payer: Self-pay | Admitting: *Deleted

## 2010-07-28 ENCOUNTER — Other Ambulatory Visit: Payer: Self-pay | Admitting: Internal Medicine

## 2010-07-28 MED ORDER — ESOMEPRAZOLE MAGNESIUM 40 MG PO CPDR: 40.0000 mg | DELAYED_RELEASE_CAPSULE | Freq: Every day | ORAL | Status: DC

## 2010-07-31 NOTE — Telephone Encounter (Signed)
Refill sent to pharmacy.   

## 2010-08-01 ENCOUNTER — Other Ambulatory Visit: Payer: Self-pay | Admitting: *Deleted

## 2010-08-01 MED ORDER — LANSOPRAZOLE 30 MG PO CPDR: 30.0000 mg | DELAYED_RELEASE_CAPSULE | Freq: Every day | ORAL | Status: DC

## 2011-07-03 ENCOUNTER — Other Ambulatory Visit: Payer: Self-pay | Admitting: Obstetrics & Gynecology

## 2011-07-03 DIAGNOSIS — Z1231 Encounter for screening mammogram for malignant neoplasm of breast: Secondary | ICD-10-CM

## 2011-07-17 ENCOUNTER — Ambulatory Visit: Payer: Self-pay

## 2011-07-30 ENCOUNTER — Other Ambulatory Visit: Payer: Self-pay | Admitting: Family Medicine

## 2011-07-30 DIAGNOSIS — Z1231 Encounter for screening mammogram for malignant neoplasm of breast: Secondary | ICD-10-CM

## 2011-08-24 ENCOUNTER — Ambulatory Visit
Admission: RE | Admit: 2011-08-24 | Discharge: 2011-08-24 | Disposition: A | Discharge status: Home / Self Care | Payer: 59 | Source: Ambulatory Visit | Attending: Family Medicine | Admitting: Family Medicine

## 2011-08-24 DIAGNOSIS — Z1231 Encounter for screening mammogram for malignant neoplasm of breast: Secondary | ICD-10-CM

## 2011-08-28 ENCOUNTER — Other Ambulatory Visit: Payer: Self-pay | Admitting: Family Medicine

## 2011-08-28 DIAGNOSIS — R928 Other abnormal and inconclusive findings on diagnostic imaging of breast: Secondary | ICD-10-CM

## 2011-09-06 ENCOUNTER — Ambulatory Visit
Admission: RE | Admit: 2011-09-06 | Discharge: 2011-09-06 | Disposition: A | Discharge status: Home / Self Care | Payer: 59 | Source: Ambulatory Visit | Attending: Family Medicine | Admitting: Family Medicine

## 2011-09-06 DIAGNOSIS — R928 Other abnormal and inconclusive findings on diagnostic imaging of breast: Secondary | ICD-10-CM

## 2011-09-15 DIAGNOSIS — M722 Plantar fascial fibromatosis: Secondary | ICD-10-CM | POA: Insufficient documentation

## 2011-11-01 ENCOUNTER — Telehealth: Payer: Self-pay | Admitting: Internal Medicine

## 2011-11-01 NOTE — Telephone Encounter (Signed)
Patient calling with a change in bowel habits x 1 month. States her bowel movements are less frequent and broken up. Scheduled patient with Dr. Leone Payor on 11/09/11 9:15 AM.

## 2011-11-09 ENCOUNTER — Ambulatory Visit (INDEPENDENT_AMBULATORY_CARE_PROVIDER_SITE_OTHER): Payer: 59 | Admitting: Internal Medicine

## 2011-11-09 ENCOUNTER — Encounter: Payer: Self-pay | Admitting: Internal Medicine

## 2011-11-09 VITALS — BP 100/60 | HR 72 | Ht 64.5 in | Wt 159.4 lb

## 2011-11-09 DIAGNOSIS — K589 Irritable bowel syndrome without diarrhea: Secondary | ICD-10-CM

## 2011-11-09 MED ORDER — OMEPRAZOLE 40 MG PO CPDR: 40.0000 mg | DELAYED_RELEASE_CAPSULE | Freq: Every day | ORAL | Status: DC

## 2011-11-09 MED ORDER — ALIGN PO CAPS: 1.0000 | ORAL_CAPSULE | Freq: Every day | ORAL | Status: AC

## 2011-11-09 NOTE — Progress Notes (Signed)
Subjective:    Patient ID: Jacqueline Boone, female    DOB: 1950-11-04, 61 y.o.   MRN: [id number]  HPI This pleasant middle-aged white woman has a history of adenomatous colon polyps, on a gastritis and IBS. Over the past one to 2 months she's had incomplete defecation problems and stools that are broken up she defecates. This has concerned her. She has strained to try to force complete evacuation on air occasional seen a streak of bright red blood on the tissue paper but no hematochezia. She's had some bloating issues like she's had in the past. She reports that omeprazole seemed to cause some sort of side effects in the past year or so, she stops taking that but more recently because of some upper epigastric discomfort she restarted it and seems to be tolerating it well. She has not had unintentional weight loss. She does admit to increasing juice in vegetables and fruit and that seems to have cause gas and more frequent defecation at times. She denies abdominal pain.  She does admit to significant stressors, in that she has a handicapped blind brother who has had health issues and she is responsible for him though he does not live with her she is involved in his care. She has job stress, she is concerned about possible insurance changes. Her husband is self-employed and she has concerns about that as well.  Recent physical was okay though she was started on vitamin D, she has been started on Biaxin by a dermatologist for some hair loss issues. No other new medications. No Known Allergies Outpatient Prescriptions Prior to Visit  Medication Sig Dispense Refill  . esomeprazole (NEXIUM) 40 MG capsule Take 1 capsule (40 mg total) by mouth daily.  30 capsule  4  . lansoprazole (PREVACID) 30 MG capsule Take 1 capsule (30 mg total) by mouth daily.  30 capsule  2   Past Medical History  Diagnosis Date  . Gastritis, chronic   . PUD (peptic ulcer disease)   . Benign neoplasm of colon     Adenomas,  cigarette adenomas  . Helicobacter pylori gastritis     eradicated  . IBS (irritable bowel syndrome)   . Wrist fracture     left  . GERD (gastroesophageal reflux disease)   . Allergic rhinitis    Past Surgical History  Procedure Date  . Abdominal hysterectomy 1974  . Cervical discectomy 1996    C4,C5  . Colonoscopy Multiple  . Esophagogastroduodenoscopy Multiple   History   Social History  . Marital Status: Married         Number of Children: 0  .     Occupational History  .  [employer]   Social History Main Topics  . Smoking status: Former Games developer  . Smokeless tobacco: Never Used  . Alcohol Use: Yes     social  . Drug Use: No  .            Social History Narrative   Marriedno kidsPatient is a former smoker.  Alcohol Use - yesDaily Caffeine UseWorks part-time at [employer]   Family History  Problem Relation Age of Onset  . Diverticulosis Father     and/or diverticulitis  . Glaucoma Brother     blind  . Mental retardation Brother   . Hodgkin's lymphoma Father      Review of Systems As per history of present illness    Objective:   Physical Exam General:  NAD Eyes:   anicteric  Abdomen:  soft and nontender, BS+ no hepatosplenomegaly or mass   Data Reviewed:  2011 colonoscopy and EGD report and pathology reports     Assessment & Plan:   1. IBS (irritable bowel syndrome)    Clinical scenario is very compatible with a flare of IBS, probably brought on by recent stressors.  1. Align 1 capsule daily for one month 2. If after 2 weeks there is not significant improvement she will add psyllium 1 teaspoon increasing up to 1 tablespoon daily 3. Reassurance and IBS handout 4. She is to call back in about a month with an update  I appreciate the opportunity to care for this patient.  CC: Tomma Lightning, MD

## 2011-11-09 NOTE — Patient Instructions (Addendum)
We have given you samples of Align. This puts good bacteria back into your colon. You should take 1 capsule by mouth once daily. If this works well for you, it can be purchased over the counter.  After two weeks if you notice no benefit try 1 tsp of metamucil then after a week move it up to 1 tablespoon everyday.  We have sent the following medications to your pharmacy for you to pick up at your convenience: Omeprazole  Read the IBS handout you have been given today.  Thank you for choosing me and [facility].  Iva Boop, M.D., [facility]

## 2012-05-12 ENCOUNTER — Ambulatory Visit (INDEPENDENT_AMBULATORY_CARE_PROVIDER_SITE_OTHER): Payer: 59 | Admitting: Family Medicine

## 2012-05-12 ENCOUNTER — Ambulatory Visit: Payer: 59

## 2012-05-12 VITALS — BP 118/76 | HR 72 | Temp 98.2°F | Resp 16 | Ht 64.0 in | Wt 164.0 lb

## 2012-05-12 DIAGNOSIS — S61316A Laceration without foreign body of right little finger with damage to nail, initial encounter: Secondary | ICD-10-CM

## 2012-05-12 DIAGNOSIS — M79609 Pain in unspecified limb: Secondary | ICD-10-CM

## 2012-05-12 DIAGNOSIS — S61209A Unspecified open wound of unspecified finger without damage to nail, initial encounter: Secondary | ICD-10-CM

## 2012-05-12 DIAGNOSIS — M79644 Pain in right finger(s): Secondary | ICD-10-CM

## 2012-05-12 DIAGNOSIS — Z23 Encounter for immunization: Secondary | ICD-10-CM

## 2012-05-12 MED ORDER — HYDROCODONE-ACETAMINOPHEN 5-325 MG PO TABS: 1.0000 | ORAL_TABLET | Freq: Four times a day (QID) | ORAL | Status: DC | PRN

## 2012-05-12 NOTE — Progress Notes (Signed)
   [address] [place] [address]   Phone [phone]  Subjective:    Patient ID: Jacqueline Boone, female    DOB: 1950-11-14, 62 y.o.   MRN: [id number]  HPI  Pt presents to clinic after an injury with her mandolin where she cut the tip of her R 5th digit off while cutting potatoes.  She is unsure when her last tetanus injection was.  She is having local pain.  Pt was seen at another office and was told to come here fore a foil graft to fix the defect.  Review of Systems  Skin: Positive for wound.       Objective:   Physical Exam  Vitals reviewed. Constitutional: She is oriented to person, place, and time. She appears well-developed and well-nourished.  HENT:  Head: Normocephalic and atraumatic.  Right Ear: External ear normal.  Left Ear: External ear normal.  Pulmonary/Chest: Effort normal.  Neurological: She is alert and oriented to person, place, and time.  Skin: Skin is warm and dry.       R 5th digit - avulsion of tip of finger including nail.  Psychiatric: She has a normal mood and affect. Her behavior is normal. Judgment and thought content normal.    [facility] reading (PRIMARY) by  Dr. Milus Glazier - Soft tissue injury - no bony involvement.  Procedure:  Consent obtained form patient.  MC block with 2% lido.  Anesthesia obtained.  Pt had avulsed skin so that was attached with 5-0 Ethilon #4 SI and Vicryl 5-0 without difficulty.  Drsg placed and fold over splint applied.        Assessment & Plan:   1. Laceration of right little finger w/o foreign body with damage to nail  DG Finger Little Right, Tdap vaccine greater than or equal to 7yo IM  2. Pain in finger of right hand  HYDROcodone-acetaminophen (NORCO/VICODIN) 5-325 MG per tablet   Wound care d/w pt.  Recheck in 10d with me for possible suture removal.  Answered Questions for patient.

## 2012-05-21 ENCOUNTER — Ambulatory Visit (INDEPENDENT_AMBULATORY_CARE_PROVIDER_SITE_OTHER): Payer: 59 | Admitting: Physician Assistant

## 2012-05-21 VITALS — BP 126/72 | HR 86 | Resp 16

## 2012-05-21 DIAGNOSIS — M79609 Pain in unspecified limb: Secondary | ICD-10-CM

## 2012-05-21 DIAGNOSIS — S61209A Unspecified open wound of unspecified finger without damage to nail, initial encounter: Secondary | ICD-10-CM

## 2012-05-21 DIAGNOSIS — S61316A Laceration without foreign body of right little finger with damage to nail, initial encounter: Secondary | ICD-10-CM

## 2012-05-21 DIAGNOSIS — M79644 Pain in right finger(s): Secondary | ICD-10-CM

## 2012-05-21 NOTE — Progress Notes (Signed)
  Subjective:    Patient ID: Jacqueline Boone, female    DOB: 1951-04-02, 62 y.o.   MRN: [id number]  HPI This 62 y.o. female presents for evaluation of wound of the tip of the RIGHT 5th finger s/p suture repair on 05/12/2012.  She's doing well, without problems or concerns.  She notes a little tenderness with accidental bumping it against hard objects.  Review of Systems As above.    Objective:   Physical Exam  Blood pressure 126/72, pulse 86, resp. rate 16. There is no weight on file to calculate BMI. Well-developed, well nourished WF who is awake, alert and oriented, in NAD. Lungs: normal effort Extremities: no cyanosis, clubbing or edema.  Skin: warm and dry without rash.  Wound healing well.  Sutures intact.  #4 Ethilon sutures removed.  #2 Vicryl sutures through the nail left in place. Psychologic: good mood and appropriate affect, normal speech and behavior.     Assessment & Plan:   1. Laceration of right little finger w/o foreign body with damage to nail   2. Pain in finger of right hand    Local wound care.  Anticipatory guidance.

## 2012-09-04 ENCOUNTER — Other Ambulatory Visit: Payer: Self-pay | Admitting: Certified Nurse Midwife

## 2012-09-04 NOTE — Telephone Encounter (Signed)
LVM for pt to return my call- pt needs to schedule aex. Last aex on 08/20/2011. Chart at my desk.

## 2012-09-05 ENCOUNTER — Other Ambulatory Visit: Payer: Self-pay

## 2012-09-05 ENCOUNTER — Telehealth: Payer: Self-pay | Admitting: *Deleted

## 2012-09-05 DIAGNOSIS — Z1231 Encounter for screening mammogram for malignant neoplasm of breast: Secondary | ICD-10-CM

## 2012-09-05 MED ORDER — ESTROGENS CONJUGATED 0.45 MG PO TABS: ORAL_TABLET | ORAL | Status: DC

## 2012-09-05 NOTE — Telephone Encounter (Signed)
Patient scheduled appt for 09/16/12 Aex needed #30 of Premarin until then.

## 2012-09-05 NOTE — Telephone Encounter (Signed)
Ok to refill #30

## 2012-09-09 ENCOUNTER — Encounter: Payer: Self-pay | Admitting: *Deleted

## 2012-09-16 ENCOUNTER — Ambulatory Visit (INDEPENDENT_AMBULATORY_CARE_PROVIDER_SITE_OTHER): Payer: 59 | Admitting: Nurse Practitioner

## 2012-09-16 ENCOUNTER — Encounter: Payer: Self-pay | Admitting: Nurse Practitioner

## 2012-09-16 VITALS — BP 128/70 | HR 76 | Resp 12 | Ht 63.5 in | Wt 158.8 lb

## 2012-09-16 DIAGNOSIS — N951 Menopausal and female climacteric states: Secondary | ICD-10-CM

## 2012-09-16 DIAGNOSIS — Z0000 Encounter for general adult medical examination without abnormal findings: Secondary | ICD-10-CM

## 2012-09-16 DIAGNOSIS — Z01419 Encounter for gynecological examination (general) (routine) without abnormal findings: Secondary | ICD-10-CM

## 2012-09-16 LAB — COMPREHENSIVE METABOLIC PANEL
ALT: 11 U/L (ref 0–35)
AST: 22 U/L (ref 0–37)
Albumin: 4.2 g/dL (ref 3.5–5.2)
Alkaline Phosphatase: 70 U/L (ref 39–117)
BUN: 18 mg/dL (ref 6–23)
CO2: 29 mEq/L (ref 19–32)
Calcium: 9.6 mg/dL (ref 8.4–10.5)
Chloride: 99 mEq/L (ref 96–112)
Creat: 0.71 mg/dL (ref 0.50–1.10)
Glucose, Bld: 100 mg/dL — ABNORMAL HIGH (ref 70–99)
Potassium: 4.7 mEq/L (ref 3.5–5.3)
Sodium: 136 mEq/L (ref 135–145)
Total Bilirubin: 0.4 mg/dL (ref 0.3–1.2)
Total Protein: 7.8 g/dL (ref 6.0–8.3)

## 2012-09-16 LAB — CBC
HCT: 42.4 % (ref 36.0–46.0)
Hemoglobin: 14.7 g/dL (ref 12.0–15.0)
MCH: 31.3 pg (ref 26.0–34.0)
MCHC: 34.7 g/dL (ref 30.0–36.0)
MCV: 90.2 fL (ref 78.0–100.0)
Platelets: 228 10*3/uL (ref 150–400)
RBC: 4.7 MIL/uL (ref 3.87–5.11)
RDW: 13.4 % (ref 11.5–15.5)
WBC: 7.5 10*3/uL (ref 4.0–10.5)

## 2012-09-16 LAB — POCT URINALYSIS DIPSTICK
Leukocytes, UA: NEGATIVE
Spec Grav, UA: 1.015
Urobilinogen, UA: NEGATIVE
pH, UA: 6

## 2012-09-16 LAB — LIPID PANEL
Cholesterol: 209 mg/dL — ABNORMAL HIGH (ref 0–200)
HDL: 90 mg/dL (ref >39)
LDL Cholesterol: 101 mg/dL — ABNORMAL HIGH (ref 0–99)
Total CHOL/HDL Ratio: 2.3 Ratio
Triglycerides: 88 mg/dL (ref <150)
VLDL: 18 mg/dL (ref 0–40)

## 2012-09-16 LAB — HEMOGLOBIN, FINGERSTICK: Hemoglobin, fingerstick: 14.4 g/dL (ref 12.0–16.0)

## 2012-09-16 LAB — ESTRADIOL: Estradiol: 21.2 pg/mL

## 2012-09-16 LAB — TSH: TSH: 2.755 u[IU]/mL (ref 0.350–4.500)

## 2012-09-16 NOTE — Progress Notes (Signed)
62 y.o. G0P0 Married Caucasian Fe here for annual exam.  Having increased vaso symptoms, insomnia, mood swings, low libido, weight gain, hair loss, vaginal dryness and  Fatigue. Currently on Premarin 0.45 and feels she needs an increase dose.  Looking back at her chart all these symptoms went away on Vivelle dot - but this caused increase in hair thinning. She wants hormone levels checked and thyroid checked.  Needs regular fasting labs for PCP. She does ad,it to increased level of stress.  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  No LMP recorded. Patient has had a hysterectomy.          Sexually active: yes  The current method of family planning is status post hysterectomy.    Exercising: yes  walking  Smoker:  no  Health Maintenance: Pap:  12/08/2009  Negative  (history of condyloma recurrence in 2011) MMG:  08/2011 appointment  10/07/12 Colonoscopy:  2011 due October 2014 BMD:   12/20/2009 T Score:  Spine 0.1; left femoral neck 0.8 TDaP:  2014 Labs: Hgb- 14.4   reports that she has quit smoking. She has never used smokeless tobacco. She reports that she drinks about 3.0 ounces of alcohol per week. She reports that she does not use illicit drugs.  Past Medical History  Diagnosis Date  . Gastritis, chronic   . PUD (peptic ulcer disease)   . Benign neoplasm of colon     Adenomas, cigarette adenomas  . Helicobacter pylori gastritis     eradicated  . IBS (irritable bowel syndrome)   . Wrist fracture     left  . GERD (gastroesophageal reflux disease)   . Allergic rhinitis   . Condyloma     Past Surgical History  Procedure Laterality Date  . Abdominal hysterectomy  1974  . Cervical discectomy  1996    C4,C5  . Colonoscopy  Multiple  . Esophagogastroduodenoscopy  Multiple    Current Outpatient Prescriptions  Medication Sig Dispense Refill  . bifidobacterium infantis (ALIGN) capsule Take 1 capsule by mouth daily.  28 capsule  0  . Biotin  5000 MCG CAPS Take 1 capsule by mouth daily.      Marland Kitchen estrogens, conjugated, (PREMARIN) 0.45 MG tablet take 1 tablet by mouth once daily  30 tablet  0  . HYDROcodone-acetaminophen (NORCO/VICODIN) 5-325 MG per tablet Take 1 tablet by mouth every 6 (six) hours as needed for pain.  20 tablet  0  . ibuprofen (ADVIL,MOTRIN) 200 MG tablet Take 200 mg by mouth as needed.      . Melatonin 3 MG CAPS Take 1 capsule by mouth as needed.      . mometasone (NASONEX) 50 MCG/ACT nasal spray Place 2 sprays into the nose daily.       No current facility-administered medications for this visit.    Family History  Problem Relation Age of Onset  . Diverticulosis Father     and/or diverticulitis  . Glaucoma Brother     blind  . Mental retardation Brother   . Hodgkin's lymphoma Father     ROS:  Pertinent items are noted in HPI.  Otherwise, a comprehensive ROS was negative.  Exam:   BP 128/70  Pulse 76  Resp 12  Ht 5' 3.5" (1.613 m)  Wt 158 lb 12.8 oz (72.031 kg)  BMI 27.69 kg/m2 Height: 5' 3.5" (161.3 cm)  Ht Readings from Last 3 Encounters:  09/16/12 5' 3.5" (1.613 m)  05/12/12 5\' 4"  (1.626  m)  11/09/11 5' 4.5" (1.638 m)    General appearance: alert, cooperative and appears stated age Head: Normocephalic, without obvious abnormality, atraumatic Neck: no adenopathy, supple, symmetrical, trachea midline and thyroid normal to inspection and palpation Lungs: clear to auscultation bilaterally Breasts: normal appearance, no masses or tenderness Heart: regular rate and rhythm Abdomen: soft, non-tender; no masses,  no organomegaly Extremities: extremities normal, atraumatic, no cyanosis or edema Skin: Skin color, texture, turgor normal. No rashes or lesions Lymph nodes: Cervical, supraclavicular, and axillary nodes normal. No abnormal inguinal nodes palpated Neurologic: Grossly normal   Pelvic: External genitalia:  no lesions              Urethra:  normal appearing urethra with no masses,  tenderness or lesions              Bartholin's and Skene's: normal                 Vagina: normal appearing vagina with normal color and discharge, no lesions              Cervix: absent              Pap taken: yes per patient request Bimanual Exam:  Uterus:  uterus absent              Adnexa: no mass, fullness, tenderness               Rectovaginal: Confirms               Anus:  normal sphincter tone, no lesions  A:  Well Woman with normal exam  S/P TAH / BSO 1974 prior abnormal pap on ERT  History of condyloma with flare 2011  Recent increase in vaso symptoms  P:   Pap smear as per guidelines   Mammogram as scheduled  Did not need refill on ERT - states recently called in and has refills till 2015  Follow with labs, checked estradiol level - most likely does not need increased dose  Discussed potential side effects and risks of ERT including DVT,CVA, cancer etc.. return annually or prn  An After Visit Summary was printed and given to the patient.

## 2012-09-16 NOTE — Patient Instructions (Signed)

## 2012-09-17 LAB — VITAMIN D 25 HYDROXY (VIT D DEFICIENCY, FRACTURES): Vit D, 25-Hydroxy: 34 ng/mL (ref 30–89)

## 2012-09-17 NOTE — Progress Notes (Signed)
Reviewed personally.  M. Suzanne Gianne Shugars, MD.  

## 2012-09-18 LAB — IPS PAP TEST WITH HPV

## 2012-09-18 MED ORDER — ESTROGENS CONJUGATED 0.625 MG PO TABS: ORAL_TABLET | ORAL | Status: DC

## 2012-09-18 NOTE — Addendum Note (Signed)
Addended by: Jerene Bears on: 09/18/2012 10:15 PM   Modules accepted: Orders

## 2012-09-23 ENCOUNTER — Other Ambulatory Visit: Payer: Self-pay | Admitting: *Deleted

## 2012-09-23 MED ORDER — ESTRADIOL 0.05 MG/24HR TD PTTW: 1.0000 | MEDICATED_PATCH | TRANSDERMAL | Status: DC

## 2012-09-23 NOTE — Telephone Encounter (Signed)
Vivelle dot 0.5mg  patches #8 3 refills  (3 months supply) - apply one patch 2x weekly. pt is aware.

## 2012-10-07 ENCOUNTER — Ambulatory Visit
Admission: RE | Admit: 2012-10-07 | Discharge: 2012-10-07 | Disposition: A | Discharge status: Home / Self Care | Payer: 59 | Source: Ambulatory Visit

## 2012-10-07 DIAGNOSIS — Z1231 Encounter for screening mammogram for malignant neoplasm of breast: Secondary | ICD-10-CM

## 2012-10-31 ENCOUNTER — Encounter: Payer: Self-pay | Admitting: *Deleted

## 2012-11-07 ENCOUNTER — Encounter: Payer: Self-pay | Admitting: Nurse Practitioner

## 2012-11-07 ENCOUNTER — Institutional Professional Consult (permissible substitution): Payer: Self-pay | Admitting: Nurse Practitioner

## 2012-11-07 DIAGNOSIS — Z01419 Encounter for gynecological examination (general) (routine) without abnormal findings: Secondary | ICD-10-CM

## 2012-11-12 ENCOUNTER — Ambulatory Visit (INDEPENDENT_AMBULATORY_CARE_PROVIDER_SITE_OTHER): Payer: 59 | Admitting: Nurse Practitioner

## 2012-11-12 ENCOUNTER — Encounter: Payer: Self-pay | Admitting: Nurse Practitioner

## 2012-11-12 VITALS — BP 100/58 | HR 64 | Wt 164.0 lb

## 2012-11-12 DIAGNOSIS — N951 Menopausal and female climacteric states: Secondary | ICD-10-CM

## 2012-11-12 NOTE — Progress Notes (Signed)
Subjective:     Patient ID: Jacqueline Boone, female   DOB: Jul 16, 1950, 62 y.o.   MRN: [id number]  HPI 62 yo WM Fe presents for a consult visit. She was seen for AEX in June with an increase in vaso symptoms.  She had originally been on Vivelle dot but later changed to Premarin. After change to Premarin 0.625 mg she had an increase in vaso symptoms and felt like hormones needed to be rechecked. She did indeed have a low estradiol level at 21.2.  She was then changed back to Vivelle dot 0.5 mg and has felt better in general. Still some night sweats and not sleeping as good as expected. Some' bloating' on the Vivelle dot. Before she had some concerns about hair thinning on the Vivelle dot.  Does not feel an  increase in hair loss this time.   States her' brain fog' is better, memory is better. Would like to repeat hormone levels.   Review of Systems  Constitutional: Negative.  Negative for fatigue and unexpected weight change.  Respiratory: Negative.   Cardiovascular: Negative.   Gastrointestinal: Negative.   Genitourinary: Negative.   Musculoskeletal: Negative.   Neurological: Negative.   Psychiatric/Behavioral: Negative.        Objective:   Physical Exam  Constitutional: She is oriented to person, place, and time. She appears well-developed and well-nourished.  Exam is not indicated.  Neurological: She is alert and oriented to person, place, and time.  Psychiatric: She has a normal mood and affect. Her behavior is normal. Judgment and thought content normal.       Assessment:     Menopausal symptoms on ERT Recent change in therapy    Plan:     Will recheck estradiol levels and follow.

## 2012-11-13 LAB — ESTRADIOL: Estradiol: 48.3 pg/mL

## 2012-11-13 NOTE — Progress Notes (Signed)
Encounter reviewed by Dr. Brook Silva.  

## 2012-11-19 ENCOUNTER — Telehealth: Payer: Self-pay | Admitting: *Deleted

## 2012-11-19 NOTE — Telephone Encounter (Signed)
Pt notified of results

## 2012-11-19 NOTE — Telephone Encounter (Signed)
Message left to return call.  RE: lab results. 

## 2012-11-19 NOTE — Telephone Encounter (Signed)
Message copied by Luisa Dago on Wed Nov 19, 2012 11:50 AM ------      Message from: Ria Comment R      Created: Thu Nov 13, 2012  8:49 AM       Let patient know good news Estradiol level has gone up from 21.1 to 48.3 (normal postmenopausal should be at about 32) so this range of ERT is good for her.  No need to increase or decrease at this time. Continue with same plan.I have also discussed with Dr. Hyacinth Meeker who agrees. ------

## 2013-02-12 ENCOUNTER — Other Ambulatory Visit: Payer: Self-pay

## 2013-03-10 ENCOUNTER — Encounter: Payer: Self-pay | Admitting: Internal Medicine

## 2013-04-22 DIAGNOSIS — J101 Influenza due to other identified influenza virus with other respiratory manifestations: Secondary | ICD-10-CM | POA: Insufficient documentation

## 2013-06-09 ENCOUNTER — Encounter: Payer: Self-pay | Admitting: Internal Medicine

## 2013-06-24 ENCOUNTER — Encounter: Payer: 59 | Admitting: Internal Medicine

## 2013-08-03 ENCOUNTER — Ambulatory Visit (AMBULATORY_SURGERY_CENTER): Payer: Self-pay

## 2013-08-03 VITALS — Ht 64.0 in | Wt 159.0 lb

## 2013-08-03 DIAGNOSIS — Z860100 Personal history of colon polyps, unspecified: Secondary | ICD-10-CM

## 2013-08-03 MED ORDER — SUPREP BOWEL PREP KIT 17.5-3.13-1.6 GM/177ML PO SOLN: 1.0000 | Freq: Once | ORAL | Status: DC

## 2013-08-03 NOTE — Progress Notes (Signed)
No allergies to eggs or soy. No diet/weight loss meds. No home oxygen. Has email.  Emmi instructions given for colonoscopy. 

## 2013-08-07 ENCOUNTER — Encounter: Payer: Self-pay | Admitting: Internal Medicine

## 2013-08-18 ENCOUNTER — Encounter: Payer: Self-pay | Admitting: Internal Medicine

## 2013-08-18 ENCOUNTER — Ambulatory Visit (AMBULATORY_SURGERY_CENTER): Payer: 59 | Admitting: Internal Medicine

## 2013-08-18 VITALS — BP 112/75 | HR 67 | Temp 98.2°F | Resp 20 | Ht 64.0 in | Wt 159.0 lb

## 2013-08-18 DIAGNOSIS — D126 Benign neoplasm of colon, unspecified: Secondary | ICD-10-CM

## 2013-08-18 DIAGNOSIS — Z860100 Personal history of colon polyps, unspecified: Secondary | ICD-10-CM

## 2013-08-18 MED ORDER — SODIUM CHLORIDE 0.9 % IV SOLN: 500.0000 mL | INTRAVENOUS | Status: DC

## 2013-08-18 NOTE — Progress Notes (Signed)
Called to room to assist during endoscopic procedure.  Patient ID and intended procedure confirmed with present staff. Received instructions for my participation in the procedure from the performing physician.  

## 2013-08-18 NOTE — Patient Instructions (Addendum)
I found and removed 4 polyps today they all look benign. I will let you know pathology results and when to have another routine colonoscopy by mail.  I appreciate the opportunity to care for you. Gatha Mayer, MD, FACG  YOU HAD AN ENDOSCOPIC PROCEDURE TODAY AT [facility]: Refer to the procedure report that was given to you for any specific questions about what was found during the examination.  If the procedure report does not answer your questions, please call your gastroenterologist to clarify.  If you requested that your care partner not be given the details of your procedure findings, then the procedure report has been included in a sealed envelope for you to review at your convenience later.  YOU SHOULD EXPECT: Some feelings of bloating in the abdomen. Passage of more gas than usual.  Walking can help get rid of the air that was put into your GI tract during the procedure and reduce the bloating. If you had a lower endoscopy (such as a colonoscopy or flexible sigmoidoscopy) you may notice spotting of blood in your stool or on the toilet paper. If you underwent a bowel prep for your procedure, then you may not have a normal bowel movement for a few days.  DIET: Your first meal following the procedure should be a light meal and then it is ok to progress to your normal diet.  A half-sandwich or bowl of soup is an example of a good first meal.  Heavy or fried foods are harder to digest and may make you feel nauseous or bloated.  Likewise meals heavy in dairy and vegetables can cause extra gas to form and this can also increase the bloating.  Drink plenty of fluids but you should avoid alcoholic beverages for 24 hours.  ACTIVITY: Your care partner should take you home directly after the procedure.  You should plan to take it easy, moving slowly for the rest of the day.  You can resume normal activity the day after the procedure however you should NOT DRIVE or use heavy machinery for  24 hours (because of the sedation medicines used during the test).    SYMPTOMS TO REPORT IMMEDIATELY: A gastroenterologist can be reached at any hour.  During normal business hours, 8:30 AM to 5:00 PM Monday through Friday, call [phone].  After hours and on weekends, please call the GI answering service at [phone] who will take a message and have the physician on call contact you.   Following lower endoscopy (colonoscopy or flexible sigmoidoscopy):  Excessive amounts of blood in the stool  Significant tenderness or worsening of abdominal pains  Swelling of the abdomen that is new, acute  Fever of 100F or higher  FOLLOW UP: If any biopsies were taken you will be contacted by phone or by letter within the next 1-3 weeks.  Call your gastroenterologist if you have not heard about the biopsies in 3 weeks.  Our staff will call the home number listed on your records the next business day following your procedure to check on you and address any questions or concerns that you may have at that time regarding the information given to you following your procedure. This is a courtesy call and so if there is no answer at the home number and we have not heard from you through the emergency physician on call, we will assume that you have returned to your regular daily activities without incident.  SIGNATURES/CONFIDENTIALITY: You and/or your care partner have  signed paperwork which will be entered into your electronic medical record.  These signatures attest to the fact that that the information above on your After Visit Summary has been reviewed and is understood.  Full responsibility of the confidentiality of this discharge information lies with you and/or your care-partner.  Recommendations Next colonoscopy will be determined by pathology results.

## 2013-08-18 NOTE — Op Note (Signed)
[place]  [address]. [place], [address]   COLONOSCOPY PROCEDURE REPORT  PATIENT: Jacqueline Boone, Jacqueline Boone  MR#: [id number] BIRTHDATE: Jul 08, 1950 , 20  yrs. old GENDER: Female ENDOSCOPIST: Gatha Mayer, MD, [facility] PROCEDURE DATE:  08/18/2013 PROCEDURE:   Colonoscopy with snare polypectomy First Screening Colonoscopy - Avg.  risk and is 50 yrs.  old or older - No.  Prior Negative Screening - Now for repeat screening. N/A  History of Adenoma - Now for follow-up colonoscopy & has been > or = to 3 yrs.  Yes hx of adenoma.  Has been 3 or more years since last colonoscopy.  Polyps Removed Today? Yes. ASA CLASS:   Class I INDICATIONS:Patient's personal history of adenomatous colon polyps.  MEDICATIONS: propofol (Diprivan) 200mg  IV, MAC sedation, administered by CRNA, and These medications were titrated to patient response per physician's verbal order  DESCRIPTION OF PROCEDURE:   After the risks benefits and alternatives of the procedure were thoroughly explained, informed consent was obtained.  A digital rectal exam revealed no abnormalities of the rectum.   The LB [id number]  endoscope was introduced through the anus and advanced to the cecum, which was identified by both the appendix and ileocecal valve. No adverse events experienced.   The quality of the prep was excellent using Suprep  The instrument was then slowly withdrawn as the colon was fully examined.  COLON FINDINGS: Four sessile polyps measuring 3-10 mm in size were found at the hepatic flexure and in the sigmoid colon.  A polypectomy was performed with a cold snare.  The resection was complete and the polyp tissue was completely retrieved.   The colon mucosa was otherwise normal.   A right colon retroflexion was performed.  Retroflexed views revealed no abnormalities. The time to cecum=2 minutes 47 seconds.  Withdrawal time=11 minutes 05 seconds.  The scope was withdrawn and the procedure  completed. COMPLICATIONS: There were no complications.  ENDOSCOPIC IMPRESSION: 1.   Four sessile polyps measuring 3-10 mm in size were found at the hepatic flexure and in the sigmoid colon; polypectomy was performed with a cold snare 2.   The colon mucosa was otherwise normal - hx polyps  RECOMMENDATIONS: Timing of repeat colonoscopy will be determined by pathology findings.   eSigned:  Gatha Mayer, MD, [facility] 08/18/2013 8:37 AM   cc: The Patient    Dr. Luetta Nutting

## 2013-08-18 NOTE — Progress Notes (Signed)
Procedure ends, to recovery, report given and VSS. 

## 2013-08-19 ENCOUNTER — Telehealth: Payer: Self-pay | Admitting: *Deleted

## 2013-08-19 NOTE — Telephone Encounter (Signed)
  Follow up Call-  Call back number 08/18/2013  Post procedure Call Back phone  # [phone]  Permission to leave phone message Yes     Patient questions:  Do you have a fever, pain , or abdominal swelling? no Pain Score  0 *  Have you tolerated food without any problems? yes  Have you been able to return to your normal activities? yes  Do you have any questions about your discharge instructions: Diet   no Medications  no Follow up visit  no  Do you have questions or concerns about your Care? no  Actions: * If pain score is 4 or above: No action needed, pain <4.

## 2013-08-26 ENCOUNTER — Encounter: Payer: Self-pay | Admitting: Internal Medicine

## 2013-08-26 NOTE — Progress Notes (Signed)
Quick Note:  5 hyperplastic polyps, 3 proximal and 2 distal - repeat colon 2020 ______

## 2013-09-17 ENCOUNTER — Ambulatory Visit: Payer: 59 | Admitting: Nurse Practitioner

## 2013-10-12 ENCOUNTER — Other Ambulatory Visit: Payer: Self-pay

## 2013-10-12 MED ORDER — ESTROGENS CONJUGATED 0.625 MG PO TABS: 0.6250 mg | ORAL_TABLET | Freq: Every day | ORAL | Status: DC

## 2013-10-12 NOTE — Telephone Encounter (Signed)
Last AEX: 09/16/2012 Last refill:09/18/2012 #30 1 refill  End: 11/12/12 Current AEX: pt is due  There was a change in therapy on last AEX   Please advise

## 2013-11-16 ENCOUNTER — Other Ambulatory Visit: Payer: Self-pay

## 2013-11-16 DIAGNOSIS — Z1231 Encounter for screening mammogram for malignant neoplasm of breast: Secondary | ICD-10-CM

## 2013-11-19 ENCOUNTER — Encounter: Payer: Self-pay | Admitting: Nurse Practitioner

## 2013-11-19 ENCOUNTER — Ambulatory Visit (INDEPENDENT_AMBULATORY_CARE_PROVIDER_SITE_OTHER): Payer: 59 | Admitting: Nurse Practitioner

## 2013-11-19 VITALS — BP 118/70 | HR 72 | Ht 63.75 in | Wt 155.0 lb

## 2013-11-19 DIAGNOSIS — Z0000 Encounter for general adult medical examination without abnormal findings: Secondary | ICD-10-CM

## 2013-11-19 DIAGNOSIS — Z01419 Encounter for gynecological examination (general) (routine) without abnormal findings: Secondary | ICD-10-CM

## 2013-11-19 DIAGNOSIS — E2839 Other primary ovarian failure: Secondary | ICD-10-CM

## 2013-11-19 DIAGNOSIS — R319 Hematuria, unspecified: Secondary | ICD-10-CM

## 2013-11-19 LAB — CBC WITH DIFFERENTIAL/PLATELET
Basophils Absolute: 0.1 10*3/uL (ref 0.0–0.1)
Basophils Relative: 1 % (ref 0–1)
Eosinophils Absolute: 0.2 10*3/uL (ref 0.0–0.7)
Eosinophils Relative: 2 % (ref 0–5)
HCT: 40.8 % (ref 36.0–46.0)
Hemoglobin: 13.8 g/dL (ref 12.0–15.0)
Lymphocytes Relative: 28 % (ref 12–46)
Lymphs Abs: 2.6 10*3/uL (ref 0.7–4.0)
MCH: 30.1 pg (ref 26.0–34.0)
MCHC: 33.8 g/dL (ref 30.0–36.0)
MCV: 88.9 fL (ref 78.0–100.0)
Monocytes Absolute: 0.6 10*3/uL (ref 0.1–1.0)
Monocytes Relative: 6 % (ref 3–12)
Neutro Abs: 5.9 10*3/uL (ref 1.7–7.7)
Neutrophils Relative %: 63 % (ref 43–77)
Platelets: 221 10*3/uL (ref 150–400)
RBC: 4.59 MIL/uL (ref 3.87–5.11)
RDW: 14.3 % (ref 11.5–15.5)
WBC: 9.4 10*3/uL (ref 4.0–10.5)

## 2013-11-19 LAB — POCT URINALYSIS DIPSTICK
Bilirubin, UA: NEGATIVE
Glucose, UA: NEGATIVE
Ketones, UA: NEGATIVE
Leukocytes, UA: NEGATIVE
Nitrite, UA: NEGATIVE
Protein, UA: NEGATIVE
Urobilinogen, UA: NEGATIVE
pH, UA: 6

## 2013-11-19 LAB — HEMOGLOBIN, FINGERSTICK: Hemoglobin, fingerstick: 14 g/dL (ref 12.0–16.0)

## 2013-11-19 MED ORDER — ESTROGENS CONJUGATED 0.625 MG PO TABS: 0.6250 mg | ORAL_TABLET | Freq: Every day | ORAL | Status: DC

## 2013-11-19 NOTE — Progress Notes (Signed)
Patient ID: Jacqueline Boone, female   DOB: 1950/10/14, 63 y.o.   MRN: [id number] 62 y.o. G0P0 Married Caucasian Fe here for annual exam.  On Premarin dose at 0.625 mg daily and doing well.  Wants to continue. No vaso symptoms.  No vaginal dryness.  Hair loss is better.  Still chronic insomnia and will try liquid Benadryl and titrate dose according to need.  Patient's last menstrual period was 04/09/1972.          Sexually active: yes   The current method of family planning is status post hysterectomy.     Exercising: No regular exercise Smoker:  no  Health Maintenance: Pap:  09/16/12, WNL, neg HR HPV  (history of condyloma recurrence in 2011) MMG:  10/07/12, Bi-Rads 1:  Negative, apt on 12/02/13 Colonoscopy:  08/18/13, hyperplastic polyp, repeat in 5 years BMD:   12/20/2009 T Score:  Spine 0.1; left femoral neck 0.8 TDaP:  2014 Labs:  HB:  14.0   Urine:  Trace RBC   reports that she has quit smoking. She has never used smokeless tobacco. She reports that she drinks about 3 ounces of alcohol per week. She reports that she does not use illicit drugs.  Past Medical History  Diagnosis Date  . Gastritis, chronic   . PUD (peptic ulcer disease)   . 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 Helicobacter pylori gastritis     eradicated  . IBS (irritable bowel syndrome)   . Wrist fracture     left  . GERD (gastroesophageal reflux disease)   . Allergic rhinitis   . Condyloma 1995  . Ledderhose's disease   . Dermatitis   . Colon polyps     Past Surgical History  Procedure Laterality Date  . Cervical discectomy  1996    C4,C5  . Colonoscopy  Multiple  . Esophagogastroduodenoscopy  Multiple  . Total abdominal hysterectomy w/ bilateral salpingoophorectomy  1974    with BSO    Current Outpatient Prescriptions  Medication Sig Dispense Refill  . desoximetasone (TOPICORT) 0.05 % cream Apply topically 2 (two) times daily. Nummular dermatitis      . estrogens,  conjugated, (PREMARIN) 0.625 MG tablet Take 1 tablet (0.625 mg total) by mouth daily. Take daily  90 tablet  3  . ibuprofen (ADVIL,MOTRIN) 200 MG tablet Take 200 mg by mouth as needed.      . Melatonin 3 MG CAPS Take 1 capsule by mouth as needed.      . mometasone (NASONEX) 50 MCG/ACT nasal spray Place 2 sprays into the nose daily.      Marland Kitchen PRESCRIPTION MEDICATION Compound cream for planter fibrolosisi (Ledder hose disease)      . Biotin 5000 MCG CAPS Take 1 capsule by mouth daily.       No current facility-administered medications for this visit.    Family History  Problem Relation Age of Onset  . Hodgkin's lymphoma Father   . Glaucoma Brother     blind  . Mental retardation Brother   . Colon cancer Neg Hx   . Pancreatic cancer Neg Hx   . Rectal cancer Neg Hx   . Stomach cancer Neg Hx     ROS:  Pertinent items are noted in HPI.  Otherwise, a comprehensive ROS was negative.  Exam:   BP 118/70  Pulse 72  Ht 5' 3.75" (1.619 m)  Wt 155 lb (70.308 kg)  BMI 26.82 kg/m2  LMP 04/09/1972 Height: 5' 3.75" (  161.9 cm)  Ht Readings from Last 3 Encounters:  11/19/13 5' 3.75" (1.619 m)  08/18/13 5\' 4"  (1.626 m)  08/03/13 5\' 4"  (1.626 m)    General appearance: alert, cooperative and appears stated age Head: Normocephalic, without obvious abnormality, atraumatic Neck: no adenopathy, supple, symmetrical, trachea midline and thyroid normal to inspection and palpation Lungs: clear to auscultation bilaterally Breasts: normal appearance, no masses or tenderness Heart: regular rate and rhythm Abdomen: soft, non-tender; no masses,  no organomegaly Extremities: extremities normal, atraumatic, no cyanosis or edema Skin: Skin color, texture, turgor normal. No rashes or lesions Lymph nodes: Cervical, supraclavicular, and axillary nodes normal. No abnormal inguinal nodes palpated Neurologic: Grossly normal   Pelvic: External genitalia:  no lesions              Urethra:  normal appearing urethra  with no masses, tenderness or lesions              Bartholin's and Skene's: normal                 Vagina: normal appearing vagina with normal color and discharge, no lesions              Cervix: absent              Pap taken: No. Bimanual Exam:  Uterus:  uterus absent              Adnexa: no mass, fullness, tenderness               Rectovaginal: Confirms               Anus:  normal sphincter tone, no lesions  A:  Well Woman with normal exam  S/P TAH ? BSO 1974 secondary to endometriosis on ERT  Chronic insomnia    P:   Reviewed health and wellness pertinent to exam  Pap smear not taken today  Mammogram is scheduled 12/02/13  Note to repeat BMD  Refill on Premarin 0.625 mg daily for a year  Counseled with risk of DVT, CVA, cancer, etc.  Follow with labs - she wants a phone call and My Chart copy  Counseled on breast self exam, mammography screening, adequate intake of calcium and vitamin D, diet and exercise, Kegel's exercises return annually or prn  An After Visit Summary was printed and given to the patient.

## 2013-11-19 NOTE — Patient Instructions (Signed)

## 2013-11-20 LAB — URINALYSIS, MICROSCOPIC ONLY
Bacteria, UA: NONE SEEN
Casts: NONE SEEN
Crystals: NONE SEEN
Squamous Epithelial / LPF: NONE SEEN

## 2013-11-20 LAB — COMPREHENSIVE METABOLIC PANEL
ALT: 10 U/L (ref 0–35)
AST: 17 U/L (ref 0–37)
Albumin: 3.9 g/dL (ref 3.5–5.2)
Alkaline Phosphatase: 63 U/L (ref 39–117)
BUN: 13 mg/dL (ref 6–23)
CO2: 25 mEq/L (ref 19–32)
Calcium: 8.9 mg/dL (ref 8.4–10.5)
Chloride: 100 mEq/L (ref 96–112)
Creat: 0.66 mg/dL (ref 0.50–1.10)
Glucose, Bld: 88 mg/dL (ref 70–99)
Potassium: 4 mEq/L (ref 3.5–5.3)
Sodium: 134 mEq/L — ABNORMAL LOW (ref 135–145)
Total Bilirubin: 0.4 mg/dL (ref 0.2–1.2)
Total Protein: 7.8 g/dL (ref 6.0–8.3)

## 2013-11-20 LAB — LIPID PANEL
Cholesterol: 196 mg/dL (ref 0–200)
HDL: 84 mg/dL (ref >39)
LDL Cholesterol: 87 mg/dL (ref 0–99)
Total CHOL/HDL Ratio: 2.3 Ratio
Triglycerides: 124 mg/dL (ref <150)
VLDL: 25 mg/dL (ref 0–40)

## 2013-11-20 LAB — URINE CULTURE
Colony Count: NO GROWTH
Organism ID, Bacteria: NO GROWTH

## 2013-11-20 LAB — VITAMIN D 25 HYDROXY (VIT D DEFICIENCY, FRACTURES): Vit D, 25-Hydroxy: 23 ng/mL — ABNORMAL LOW (ref 30–89)

## 2013-11-20 LAB — TSH: TSH: 2.27 u[IU]/mL (ref 0.350–4.500)

## 2013-11-22 NOTE — Progress Notes (Signed)
Encounter reviewed by Dr. Ketura Sirek Silva.  

## 2013-11-23 ENCOUNTER — Telehealth: Payer: Self-pay

## 2013-11-23 NOTE — Telephone Encounter (Signed)
Pt informed of results below. Pt voiced understanding and agreed. Pt want to try to take a multi vitamin daily due Vitamin D tablets gives pt stomach issues.

## 2013-11-23 NOTE — Telephone Encounter (Signed)
Message copied by Gerda Diss on Mon Nov 23, 2013 10:08 AM ------      Message from: Antonietta Barcelona      Created: Sun Nov 22, 2013  7:33 PM       Results via my chart - BUT patient wanted a phone call to review.            Jacqueline Boone,  Your labs show that your urine culture is negative and micro did not show any red blood cells.  The lipid panel show that your total cholesterol has come down form last year at 209 to 196 and the LDL (bad cholesterol) from 101 to 87.  Keep up with the low cholesterol diet.  The TSH - thyroid and CBC -complete blood count is normal.  The Vit D level has gone down from 34 to 23.  When [name] calls you she will discuss next step to see if you want weekly Vit D or daily Vit D.  The kidney function test shows a slight low sodium but all else including the liver function and glucose is normal. ------

## 2013-12-02 ENCOUNTER — Ambulatory Visit
Admission: RE | Admit: 2013-12-02 | Discharge: 2013-12-02 | Disposition: A | Discharge status: Home / Self Care | Payer: 59 | Source: Ambulatory Visit

## 2013-12-02 DIAGNOSIS — Z1231 Encounter for screening mammogram for malignant neoplasm of breast: Secondary | ICD-10-CM

## 2014-07-12 ENCOUNTER — Ambulatory Visit (INDEPENDENT_AMBULATORY_CARE_PROVIDER_SITE_OTHER): Payer: 59 | Admitting: Obstetrics & Gynecology

## 2014-07-12 ENCOUNTER — Telehealth: Payer: Self-pay | Admitting: Nurse Practitioner

## 2014-07-12 VITALS — BP 130/64 | HR 64 | Temp 98.1°F | Resp 16 | Wt 159.6 lb

## 2014-07-12 DIAGNOSIS — R350 Frequency of micturition: Secondary | ICD-10-CM

## 2014-07-12 DIAGNOSIS — N3001 Acute cystitis with hematuria: Secondary | ICD-10-CM | POA: Diagnosis not present

## 2014-07-12 LAB — POCT URINALYSIS DIPSTICK
Bilirubin, UA: NEGATIVE
Glucose, UA: NEGATIVE
Ketones, UA: NEGATIVE
Nitrite, UA: POSITIVE
Protein, UA: NEGATIVE
Urobilinogen, UA: NEGATIVE
pH, UA: 5

## 2014-07-12 MED ORDER — SULFAMETHOXAZOLE-TRIMETHOPRIM 800-160 MG PO TABS: 1.0000 | ORAL_TABLET | Freq: Two times a day (BID) | ORAL | Status: DC

## 2014-07-12 NOTE — Telephone Encounter (Signed)
Pt says she is sure she has a bladder infection.

## 2014-07-12 NOTE — Patient Instructions (Signed)
You can also take AZO standard up to three times a day.  This will make your urine really, really orange.

## 2014-07-12 NOTE — Telephone Encounter (Signed)
Spoke with patient. She states she woke up this morning with increased urinary urgency and frequency. States she is voiding in small amounts only. States she feels she is sure she has a bladder infection. Patient available today for office visit. Spoke with Lamont Snowball, RN and okay for office visit today at 3:45 with Dr. Sabra Heck. Patient agreeable and scheduled. Routing to provider for final review. Patient agreeable to disposition. Will close encounter

## 2014-07-12 NOTE — Progress Notes (Signed)
Subjective:     Patient ID: Jacqueline Boone, female   DOB: Nov 23, 1950, 64 y.o.   MRN: [id number]  HPI 64 yo WF here with a few hours onset of increased urinary frequency and urgency as well as dysuria.  Has only been for a few hours today but this feels like a UTI to the pt.  She hasn't had one in several years but is already feeling uncomfortable with it.  No back pain.  No chills.  No fever.  Hasn't seen any blood.  Denies vaginal bleeding.  Review of Systems  Genitourinary: Positive for dysuria, urgency and frequency. Negative for hematuria.  All other systems reviewed and are negative.      Objective:   Physical Exam  Constitutional: She is oriented to person, place, and time. She appears well-developed and well-nourished.  Abdominal: There is no CVA tenderness.  Neurological: She is alert and oriented to person, place, and time.  Skin: Skin is warm and dry.  Psychiatric: She has a normal mood and affect.       Assessment:     Probable Urinary tract infection without complications     Plan:     Urine micro and culture pending. Bactrim DS BID x 5 days.  Pt will probably need TOC as well.

## 2014-07-13 LAB — URINALYSIS, MICROSCOPIC ONLY
Casts: NONE SEEN
Crystals: NONE SEEN
Squamous Epithelial / LPF: NONE SEEN

## 2014-07-15 ENCOUNTER — Telehealth: Payer: Self-pay

## 2014-07-15 ENCOUNTER — Encounter: Payer: Self-pay | Admitting: Obstetrics & Gynecology

## 2014-07-15 LAB — URINE CULTURE: Colony Count: 70000

## 2014-07-15 NOTE — Telephone Encounter (Signed)
Lmtcb//kn 

## 2014-07-15 NOTE — Telephone Encounter (Signed)
-----   Message from Megan Salon, MD sent at 07/15/2014  6:24 AM EDT ----- Please inform pt her urine culture was positive for E Coli.  Needs TOC with urine micro in two weeks.  Take all antibiotics and let me know if symptoms don't fully resolve.  Orders placed.

## 2014-07-15 NOTE — Addendum Note (Signed)
Addended by: Megan Salon on: 07/15/2014 06:26 AM   Modules accepted: Orders

## 2014-07-19 NOTE — Telephone Encounter (Signed)
Patient canceled her urine toc appointment 07/30/14. Patient will call later to rs.

## 2014-07-20 NOTE — Telephone Encounter (Signed)
Dr Sabra Heck, this patient has not called back to schedule urine TOC. Do you want me to put her in my remind me or just close encounter and let her call back when she is able? Please advise.//kn

## 2014-07-21 NOTE — Telephone Encounter (Signed)
Yes.  Ok to close encounter.  She is aware of recommendations.

## 2014-07-30 ENCOUNTER — Ambulatory Visit: Payer: 59

## 2014-11-16 ENCOUNTER — Encounter: Payer: Self-pay | Admitting: Internal Medicine

## 2014-12-27 ENCOUNTER — Other Ambulatory Visit: Payer: Self-pay | Admitting: Nurse Practitioner

## 2014-12-27 ENCOUNTER — Telehealth: Payer: Self-pay | Admitting: Nurse Practitioner

## 2014-12-27 MED ORDER — ESTROGENS CONJUGATED 0.625 MG PO TABS: 0.6250 mg | ORAL_TABLET | Freq: Every day | ORAL | Status: DC

## 2014-12-27 NOTE — Telephone Encounter (Signed)
Needs update on Mammogram

## 2014-12-27 NOTE — Telephone Encounter (Addendum)
Patient says she is returning a call to [place].No open tc note.

## 2014-12-27 NOTE — Telephone Encounter (Signed)
Patient calling requesting a refill on Premarin to be called to her pharmacy on file.

## 2014-12-27 NOTE — Telephone Encounter (Signed)
Patient notified of message from Kem Boroughs, [place] and will schedule mammogram.

## 2014-12-27 NOTE — Telephone Encounter (Signed)
Returning Jacqueline Boone's call from refill encounter. Patient needs mammogram for additional refills of Premarin tablets. Patient agreeable. Will schedule mammogram.  Routing to provider for final review. Patient agreeable to disposition. Will close encounter.

## 2014-12-27 NOTE — Telephone Encounter (Signed)
Medication refill request: Premarin  Last AEX:  11/19/13 PG Next AEX: 02/09/15 PG Last MMG (if hormonal medication request): 12/03/13 BIRADS1:neg Refill authorized: 11/19/13 #90tabs/3R. Today #90/0R?

## 2014-12-27 NOTE — Telephone Encounter (Signed)
Left voice mail to call back 

## 2015-01-01 ENCOUNTER — Other Ambulatory Visit: Payer: Self-pay | Admitting: Nurse Practitioner

## 2015-01-03 NOTE — Telephone Encounter (Signed)
Premarin #30tabs/0R sent 12/27/14 to [facility]. Will need MMG for further refills.

## 2015-01-05 ENCOUNTER — Other Ambulatory Visit: Payer: Self-pay

## 2015-01-05 DIAGNOSIS — Z1231 Encounter for screening mammogram for malignant neoplasm of breast: Secondary | ICD-10-CM

## 2015-01-12 ENCOUNTER — Ambulatory Visit
Admission: RE | Admit: 2015-01-12 | Discharge: 2015-01-12 | Disposition: A | Discharge status: Home / Self Care | Payer: 59 | Source: Ambulatory Visit

## 2015-01-12 DIAGNOSIS — Z1231 Encounter for screening mammogram for malignant neoplasm of breast: Secondary | ICD-10-CM

## 2015-02-09 ENCOUNTER — Encounter: Payer: Self-pay | Admitting: Nurse Practitioner

## 2015-02-09 ENCOUNTER — Telehealth: Payer: Self-pay | Admitting: Nurse Practitioner

## 2015-02-09 ENCOUNTER — Ambulatory Visit (INDEPENDENT_AMBULATORY_CARE_PROVIDER_SITE_OTHER): Payer: 59 | Admitting: Nurse Practitioner

## 2015-02-09 VITALS — BP 110/66 | HR 60 | Ht 63.25 in | Wt 162.0 lb

## 2015-02-09 DIAGNOSIS — Z01419 Encounter for gynecological examination (general) (routine) without abnormal findings: Secondary | ICD-10-CM | POA: Diagnosis not present

## 2015-02-09 DIAGNOSIS — Z79890 Hormone replacement therapy (postmenopausal): Secondary | ICD-10-CM

## 2015-02-09 DIAGNOSIS — E2839 Other primary ovarian failure: Secondary | ICD-10-CM

## 2015-02-09 DIAGNOSIS — Z0000 Encounter for general adult medical examination without abnormal findings: Secondary | ICD-10-CM

## 2015-02-09 MED ORDER — ESTROGENS CONJUGATED 0.625 MG PO TABS: 0.6250 mg | ORAL_TABLET | Freq: Every day | ORAL | Status: DC

## 2015-02-09 MED ORDER — HYDROCHLOROTHIAZIDE 12.5 MG PO CAPS: 12.5000 mg | ORAL_CAPSULE | Freq: Every day | ORAL | Status: DC

## 2015-02-09 NOTE — Patient Instructions (Signed)

## 2015-02-09 NOTE — Progress Notes (Signed)
Patient ID: Jacqueline Boone, female   DOB: 05-10-1950, 64 y.o.   MRN: [id number] 64 y.o. G0P0 Married  Caucasian Fe here for annual exam.  Still some insomnia but better.  She feels well.  Will be returning to have labs.  Patient's last menstrual period was 04/09/1972 (approximate).          Sexually active: Yes.    The current method of family planning is status post hysterectomy.    Exercising: Yes.    Home exercise routine includes at least twice weekly. Smoker:  no  Health Maintenance: Pap:  09/16/12, Negative with neg HR HPV  (history of condyloma recurrence in 2011) MMG: 01/12/15, Bi-Rads 1:  Negative  Colonoscopy:  08/18/13, hyperplastic polyp, repeat in 5 years BMD:   12/20/2009 T Score:  Spine 0.1; left femoral neck 0.8 TDaP:  2014 Shingles: not done yet Labs: Need to be drawn   reports that she has quit smoking. She has never used smokeless tobacco. She reports that she drinks about 3.0 oz of alcohol per week. She reports that she does not use illicit drugs.  Past Medical History  Diagnosis Date  . Gastritis, chronic   . PUD (peptic ulcer disease)   . 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 Helicobacter pylori gastritis     eradicated  . IBS (irritable bowel syndrome)   . Wrist fracture     left  . GERD (gastroesophageal reflux disease)   . Allergic rhinitis   . Condyloma 1995  . Ledderhose's disease   . Dermatitis   . Colon polyps     Past Surgical History  Procedure Laterality Date  . Cervical discectomy  1996    C4,C5  . Colonoscopy  Multiple  . Esophagogastroduodenoscopy  Multiple  . Total abdominal hysterectomy w/ bilateral salpingoophorectomy  1974    with BSO    Current Outpatient Prescriptions  Medication Sig Dispense Refill  . Biotin 5000 MCG CAPS Take 1 capsule by mouth daily.    Marland Kitchen desoximetasone (TOPICORT) 0.05 % cream Apply topically 2 (two) times daily. Nummular dermatitis    . estrogens, conjugated, (PREMARIN) 0.625  MG tablet Take 1 tablet (0.625 mg total) by mouth daily. Take daily 30 tablet 0  . ibuprofen (ADVIL,MOTRIN) 200 MG tablet Take 200 mg by mouth as needed.    . Melatonin 3 MG CAPS Take 1 capsule by mouth as needed.    . mometasone (NASONEX) 50 MCG/ACT nasal spray Place 2 sprays into the nose daily.    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 sulfamethoxazole-trimethoprim (BACTRIM DS) 800-160 MG per tablet Take 1 tablet by mouth 2 (two) times daily. 10 tablet 0   No current facility-administered medications for this visit.    Family History  Problem Relation Age of Onset  . Hodgkin's lymphoma Father   . Glaucoma Brother     blind  . Mental retardation Brother   . Colon cancer Neg Hx   . Pancreatic cancer Neg Hx   . Rectal cancer Neg Hx   . Stomach cancer Neg Hx     ROS:  Pertinent items are noted in HPI.  Otherwise, a comprehensive ROS was negative.  Exam:   BP 110/66 mmHg  Pulse 60  Ht 5' 3.25" (1.607 m)  Wt 162 lb (73.483 kg)  BMI 28.45 kg/m2  LMP 04/09/1972 (Approximate) Height: 5' 3.25" (160.7 cm) Ht Readings from Last 3 Encounters:  02/09/15 5' 3.25" (1.607 m)  11/19/13 5' 3.75" (1.619 m)  08/18/13 5\' 4"  (1.626 m)    General appearance: alert, cooperative and appears stated age Head: Normocephalic, without obvious abnormality, atraumatic Neck: no adenopathy, supple, symmetrical, trachea midline and thyroid normal to inspection and palpation Lungs: clear to auscultation bilaterally Breasts: normal appearance, no masses or tenderness Heart: regular rate and rhythm Abdomen: soft, non-tender; no masses,  no organomegaly Extremities: extremities normal, atraumatic, no cyanosis or edema Skin: Skin color, texture, turgor normal. No rashes or lesions Lymph nodes: Cervical, supraclavicular, and axillary nodes normal. No abnormal inguinal nodes palpated Neurologic: Grossly normal   Pelvic: External genitalia:  no lesions               Urethra:  normal appearing urethra with no masses, tenderness or lesions              Bartholin's and Skene's: normal                 Vagina: normal appearing vagina with normal color and discharge, no lesions              Cervix: absent              Pap taken: Yes.   Bimanual Exam:  Uterus:  uterus absent              Adnexa: no mass, fullness, tenderness               Rectovaginal: Confirms               Anus:  normal sphincter tone, no lesions  Chaperone present: yes  A:  Well Woman with normal exam  S/P TAH /BSO 1974 secondary to endometriosis on ERT Chronic insomnia  Remote history of condyloma 1995  History of idiopathic edema    P:   Reviewed health and wellness pertinent to exam  Pap smear as above  Mammogram is due 01/2016, order for Dexa is sent  Will return for labs - will need a phone call afterwards to review  Refill on Premarin 0.625 mg - she will now try to taper at 1/2 tablet every other day for a month - then go to 1/2 tablet daily  Counseled with risk of DVT, CVA, cancer, etc  Counseled on breast self exam, mammography screening, adequate intake of calcium and vitamin D, diet and exercise return annually or prn  An After Visit Summary was printed and given to the patient.

## 2015-02-09 NOTE — Telephone Encounter (Signed)
Patient called to cancel lab appointment for tomorrow, will call back and reschedule once she knows work schedule.

## 2015-02-10 ENCOUNTER — Other Ambulatory Visit: Payer: 59

## 2015-02-10 NOTE — Progress Notes (Signed)
Encounter reviewed by Dr. Brook Amundson C. Silva.  

## 2015-02-14 LAB — IPS PAP TEST WITH HPV

## 2015-12-28 ENCOUNTER — Telehealth: Payer: Self-pay | Admitting: Nurse Practitioner

## 2015-12-28 NOTE — Telephone Encounter (Signed)
Patient called and scheduled fasting labs on 02/08/16 about a week before her AEX. Please put in orders for her.

## 2015-12-28 NOTE — Telephone Encounter (Signed)
Precious Bard- Please advise what labs you would like for this patient? Her lab appointment is scheduled for 02/08/16 at 0900 for fasting labs, and her AEX is scheduled for 02/14/16 at 1430.   Routing to provider for review.

## 2016-01-02 ENCOUNTER — Other Ambulatory Visit: Payer: Self-pay | Admitting: Nurse Practitioner

## 2016-01-02 DIAGNOSIS — Z0000 Encounter for general adult medical examination without abnormal findings: Secondary | ICD-10-CM

## 2016-01-02 NOTE — Telephone Encounter (Signed)
Message noted from Kem Boroughs, [place]. Orders present for lab appointment. Encounter closed.

## 2016-01-02 NOTE — Telephone Encounter (Signed)
Orders are placed 

## 2016-02-06 ENCOUNTER — Other Ambulatory Visit: Payer: Self-pay | Admitting: Family Medicine

## 2016-02-06 DIAGNOSIS — Z1231 Encounter for screening mammogram for malignant neoplasm of breast: Secondary | ICD-10-CM

## 2016-02-08 ENCOUNTER — Other Ambulatory Visit: Payer: 59

## 2016-02-14 ENCOUNTER — Ambulatory Visit: Payer: 59 | Admitting: Nurse Practitioner

## 2016-02-16 ENCOUNTER — Ambulatory Visit
Admission: RE | Admit: 2016-02-16 | Discharge: 2016-02-16 | Disposition: A | Discharge status: Home / Self Care | Payer: 59 | Source: Ambulatory Visit | Attending: Family Medicine | Admitting: Family Medicine

## 2016-02-16 DIAGNOSIS — Z1231 Encounter for screening mammogram for malignant neoplasm of breast: Secondary | ICD-10-CM

## 2016-05-18 ENCOUNTER — Encounter: Payer: Self-pay | Admitting: Nurse Practitioner

## 2016-05-18 ENCOUNTER — Ambulatory Visit: Payer: 59 | Admitting: Nurse Practitioner

## 2016-05-18 ENCOUNTER — Telehealth: Payer: Self-pay | Admitting: Nurse Practitioner

## 2016-05-18 NOTE — Telephone Encounter (Signed)
Patient called and left a message cancelling her AEX for today. She did not give a reason why.  I called her back and left a message to call back to reschedule.

## 2016-06-18 ENCOUNTER — Ambulatory Visit (INDEPENDENT_AMBULATORY_CARE_PROVIDER_SITE_OTHER): Payer: 59 | Admitting: Nurse Practitioner

## 2016-06-18 ENCOUNTER — Ambulatory Visit: Payer: 59 | Admitting: Nurse Practitioner

## 2016-06-18 ENCOUNTER — Encounter: Payer: Self-pay | Admitting: Nurse Practitioner

## 2016-06-18 VITALS — BP 130/80 | HR 78 | Resp 12 | Ht 64.0 in | Wt 164.6 lb

## 2016-06-18 DIAGNOSIS — Z0000 Encounter for general adult medical examination without abnormal findings: Secondary | ICD-10-CM | POA: Diagnosis not present

## 2016-06-18 DIAGNOSIS — E2839 Other primary ovarian failure: Secondary | ICD-10-CM

## 2016-06-18 DIAGNOSIS — E559 Vitamin D deficiency, unspecified: Secondary | ICD-10-CM

## 2016-06-18 DIAGNOSIS — R899 Unspecified abnormal finding in specimens from other organs, systems and tissues: Secondary | ICD-10-CM

## 2016-06-18 DIAGNOSIS — Z01411 Encounter for gynecological examination (general) (routine) with abnormal findings: Secondary | ICD-10-CM

## 2016-06-18 LAB — HEPATITIS C ANTIBODY: HCV Ab: NEGATIVE

## 2016-06-18 LAB — HEMOGLOBIN, FINGERSTICK: Hemoglobin, fingerstick: 13.6 g/dL (ref 12.0–15.0)

## 2016-06-18 MED ORDER — HYDROCHLOROTHIAZIDE 12.5 MG PO CAPS: 12.5000 mg | ORAL_CAPSULE | Freq: Every day | ORAL | 2 refills | Status: DC

## 2016-06-18 MED ORDER — ESTROGENS CONJUGATED 0.625 MG PO TABS: 0.6250 mg | ORAL_TABLET | Freq: Every day | ORAL | 4 refills | Status: DC

## 2016-06-18 NOTE — Progress Notes (Signed)
Patient ID: Jacqueline Boone, female   DOB: 06/28/1950, 66 y.o.   MRN: [id number]  66 y.o. G0P0000 Married  Caucasian Fe here for annual exam.  No new health problems this year. Will be retiring this year.  Patient's last menstrual period was 04/09/1972 (approximate).          Sexually active: Yes.    The current method of family planning is status post hysterectomy secondary to endometriosis.  Exercising: Yes.    Walking Smoker:  no  Health Maintenance: Pap: 02/09/15, Negative with neg HR HPV  09/16/12, Negative with neg HR HPV  12/08/09, Negative (history of condyloma recurrence in 2011) MMG: 02/16/16, Bi-Rads 1:  Negative  Colonoscopy: 08/18/13, hyperplastic polyp, repeat in 5 years BMD: 12/20/2009 T Score:  Spine 0.1; left femoral neck 0.8 TDaP:  2014 Shingles: No Pneumonia: No Hep C and HIV: No Labs: Blood drawn today   reports that she has quit smoking. She has never used smokeless tobacco. She reports that she drinks about 3.0 oz of alcohol per week . She reports that she does not use drugs.  Past Medical History:  Diagnosis Date  . Allergic rhinitis   . 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 Colon polyps   . Condyloma 1995  . Dermatitis   . Gastritis, chronic   . GERD (gastroesophageal reflux disease)   . Helicobacter pylori gastritis    eradicated  . IBS (irritable bowel syndrome)   . Ledderhose's disease   . PUD (peptic ulcer disease)   . Wrist fracture    left    Past Surgical History:  Procedure Laterality Date  . CERVICAL DISCECTOMY  1996   C4,C5  . COLONOSCOPY  Multiple  . ESOPHAGOGASTRODUODENOSCOPY  Multiple  . TOTAL ABDOMINAL HYSTERECTOMY W/ BILATERAL SALPINGOOPHORECTOMY  1974   with BSO    Current Outpatient Prescriptions  Medication Sig Dispense Refill  . Biotin 5000 MCG CAPS Take 1 capsule by mouth daily.    Marland Kitchen desoximetasone (TOPICORT) 0.05 % cream Apply topically 2 (two) times daily. Nummular dermatitis    . estrogens,  conjugated, (PREMARIN) 0.625 MG tablet Take 1 tablet (0.625 mg total) by mouth daily. Take daily 90 tablet 4  . hydrochlorothiazide (MICROZIDE) 12.5 MG capsule Take 1 capsule (12.5 mg total) by mouth daily. 30 capsule 1  . ibuprofen (ADVIL,MOTRIN) 200 MG tablet Take 200 mg by mouth as needed.    . Melatonin 3 MG CAPS Take 1 capsule by mouth as needed.    . mometasone (NASONEX) 50 MCG/ACT nasal spray Place 2 sprays into the nose daily.    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     Family History  Problem Relation Age of Onset  . Hodgkin's lymphoma Father   . Glaucoma Brother     blind  . Mental retardation Brother   . Colon cancer Neg Hx   . Pancreatic cancer Neg Hx   . Rectal cancer Neg Hx   . Stomach cancer Neg Hx     ROS:  Pertinent items are noted in HPI.  Otherwise, a comprehensive ROS was negative.  Exam:   BP 130/80 (BP Location: Right Arm, Patient Position: Sitting, Cuff Size: Normal)   Pulse 78   Resp 12   Ht 5\' 4"  (1.626 m)   Wt 164 lb 9.6 oz (74.7 kg)   LMP 04/09/1972 (Approximate)   BMI 28.25  kg/m  Height: 5\' 4"  (162.6 cm) Ht Readings from Last 3 Encounters:  06/18/16 5\' 4"  (1.626 m)  02/09/15 5' 3.25" (1.607 m)  11/19/13 5' 3.75" (1.619 m)    General appearance: alert, cooperative and appears stated age Head: Normocephalic, without obvious abnormality, atraumatic Neck: no adenopathy, supple, symmetrical, trachea midline and thyroid normal to inspection and palpation Lungs: clear to auscultation bilaterally Breasts: normal appearance, no masses or tenderness Heart: regular rate and rhythm Abdomen: soft, non-tender; no masses,  no organomegaly Extremities: extremities normal, atraumatic, no cyanosis or edema Skin: Skin color, texture, turgor normal. No rashes or lesions Lymph nodes: Cervical, supraclavicular, and axillary nodes normal. No abnormal  inguinal nodes palpated Neurologic: Grossly normal   Pelvic: External genitalia:  no lesions              Urethra:  normal appearing urethra with no masses, tenderness or lesions              Bartholin's and Skene's: normal                 Vagina: normal appearing vagina with normal color and discharge, no lesions              Cervix: absent              Pap taken: No. Bimanual Exam:  Uterus:  uterus absent              Adnexa: no mass, fullness, tenderness               Rectovaginal: Confirms               Anus:  normal sphincter tone, no lesions  Chaperone present: yes  A:  Well Woman with normal exam    S/P TAH /BSO 1974 secondary to endometriosis on ERT Chronic insomnia             Remote history of condyloma 1995             History of idiopathic edema   P:   Reviewed health and wellness pertinent to exam  Pap smear not done  Mammogram is due 11/18  Order for BMD  Refill on Premarin 0.625 mg - 1/2 tablet most days  Counseled on potential risk of DVT, CVA, cancer, etc  Refill HCTZ for fluid prn  Counseled on breast self exam, mammography screening, use and side effects of HRT, adequate intake of calcium and vitamin D, diet and exercise, Kegel's exercises return annually or prn  An After Visit Summary was printed and given to the patient.

## 2016-06-18 NOTE — Progress Notes (Deleted)
Patient ID: Jacqueline Boone, female   DOB: [birth year], 66 y.o.   MRN: [id number]  66 y.o. G0P0000 Married  Caucasian Fe here for annual exam.    Patient's last menstrual period was 04/09/1972 (approximate).          Sexually active: {yes no:314532}  The current method of family planning is {contraception:315051}.    Exercising: {yes no:314532}  {types:19826} Smoker:  {YES NO:22349}  Health Maintenance: Pap: 02/09/15, Negative with neg HR HPV  09/16/12, Negative with neg HR HPV  12/08/09, Negative (history of condyloma recurrence in 2011) MMG: 02/16/16, Bi-Rads 1:  Negative  Colonoscopy: 08/18/13, hyperplastic polyp, repeat in 5 years BMD: 12/20/2009 T Score:  Spine 0.1; left femoral neck 0.8 TDaP:  2014 Shingles: *** Pneumonia: *** Hep C and HIV: *** Labs: ***   reports that she has quit smoking. She has never used smokeless tobacco. She reports that she drinks about 3.0 oz of alcohol per week . She reports that she does not use drugs.  Past Medical History:  Diagnosis Date  . Allergic rhinitis   . 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 Colon polyps   . Condyloma 1995  . Dermatitis   . Gastritis, chronic   . GERD (gastroesophageal reflux disease)   . Helicobacter pylori gastritis    eradicated  . IBS (irritable bowel syndrome)   . Ledderhose's disease   . PUD (peptic ulcer disease)   . Wrist fracture    left    Past Surgical History:  Procedure Laterality Date  . CERVICAL DISCECTOMY  1996   C4,C5  . COLONOSCOPY  Multiple  . ESOPHAGOGASTRODUODENOSCOPY  Multiple  . TOTAL ABDOMINAL HYSTERECTOMY W/ BILATERAL SALPINGOOPHORECTOMY  1974   with BSO    Current Outpatient Prescriptions  Medication Sig Dispense Refill  . Biotin 5000 MCG CAPS Take 1 capsule by mouth daily.    Marland Kitchen desoximetasone (TOPICORT) 0.05 % cream Apply topically 2 (two) times daily. Nummular dermatitis    . estrogens, conjugated, (PREMARIN) 0.625 MG tablet Take 1 tablet (0.625 mg  total) by mouth daily. Take daily 90 tablet 4  . hydrochlorothiazide (MICROZIDE) 12.5 MG capsule Take 1 capsule (12.5 mg total) by mouth daily. 30 capsule 1  . ibuprofen (ADVIL,MOTRIN) 200 MG tablet Take 200 mg by mouth as needed.    . Melatonin 3 MG CAPS Take 1 capsule by mouth as needed.    . mometasone (NASONEX) 50 MCG/ACT nasal spray Place 2 sprays into the nose daily.    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     Family History  Problem Relation Age of Onset  . Hodgkin's lymphoma Father   . Glaucoma Brother     blind  . Mental retardation Brother   . Colon cancer Neg Hx   . Pancreatic cancer Neg Hx   . Rectal cancer Neg Hx   . Stomach cancer Neg Hx     ROS:  Pertinent items are noted in HPI.  Otherwise, a comprehensive ROS was negative.  Exam:   LMP 04/09/1972 (Approximate)    Ht Readings from Last 3 Encounters:  02/09/15 5' 3.25" (1.607 m)  11/19/13 5' 3.75" (1.619 m)  08/18/13 5\' 4"  (1.626 m)    General appearance: alert, cooperative and appears stated age Head: Normocephalic, without obvious abnormality, atraumatic Neck: no adenopathy, supple, symmetrical, trachea midline and thyroid {EXAM; THYROID:18604} Lungs: clear to  auscultation bilaterally Breasts: {Exam; breast:13139::"normal appearance, no masses or tenderness"} Heart: regular rate and rhythm Abdomen: soft, non-tender; no masses,  no organomegaly Extremities: extremities normal, atraumatic, no cyanosis or edema Skin: Skin color, texture, turgor normal. No rashes or lesions Lymph nodes: Cervical, supraclavicular, and axillary nodes normal. No abnormal inguinal nodes palpated Neurologic: Grossly normal   Pelvic: External genitalia:  no lesions              Urethra:  normal appearing urethra with no masses, tenderness or lesions              Bartholin's and Skene's: normal                  Vagina: normal appearing vagina with normal color and discharge, no lesions              Cervix: {exam; cervix:14595}              Pap taken: {yes no:314532} Bimanual Exam:  Uterus:  {exam; uterus:12215}              Adnexa: {exam; adnexa:12223}               Rectovaginal: Confirms               Anus:  normal sphincter tone, no lesions  Chaperone present: ***  A:  Well Woman with normal exam  S/P TAH /BSO 1974 secondary to endometriosis on ERT Chronic insomnia             Remote history of condyloma 1995             History of idiopathic edema  P:   Reviewed health and wellness pertinent to exam  Pap smear as above  {plan; gyn:5269::"mammogram","pap smear","return annually or prn"}  An After Visit Summary was printed and given to the patient.

## 2016-06-18 NOTE — Progress Notes (Signed)
Encounter reviewed by Dr. Icesis Renn Amundson C. Silva.  

## 2016-06-18 NOTE — Patient Instructions (Signed)

## 2016-06-19 LAB — COMPREHENSIVE METABOLIC PANEL
ALT: 10 U/L (ref 6–29)
AST: 18 U/L (ref 10–35)
Albumin: 3.9 g/dL (ref 3.6–5.1)
Alkaline Phosphatase: 71 U/L (ref 33–130)
BUN: 19 mg/dL (ref 7–25)
CO2: 25 mmol/L (ref 20–31)
Calcium: 8.5 mg/dL — ABNORMAL LOW (ref 8.6–10.4)
Chloride: 103 mmol/L (ref 98–110)
Creat: 0.79 mg/dL (ref 0.50–0.99)
Glucose, Bld: 92 mg/dL (ref 65–99)
Potassium: 4.3 mmol/L (ref 3.5–5.3)
Sodium: 137 mmol/L (ref 135–146)
Total Bilirubin: 0.3 mg/dL (ref 0.2–1.2)
Total Protein: 7.3 g/dL (ref 6.1–8.1)

## 2016-06-19 LAB — LIPID PANEL
Cholesterol: 218 mg/dL — ABNORMAL HIGH (ref <200)
HDL: 93 mg/dL (ref >50)
LDL Cholesterol: 104 mg/dL — ABNORMAL HIGH (ref <100)
Total CHOL/HDL Ratio: 2.3 Ratio (ref <5.0)
Triglycerides: 104 mg/dL (ref <150)
VLDL: 21 mg/dL (ref <30)

## 2016-06-19 LAB — HIV ANTIBODY (ROUTINE TESTING W REFLEX): HIV 1&2 Ab, 4th Generation: NONREACTIVE

## 2016-06-19 LAB — VITAMIN D 25 HYDROXY (VIT D DEFICIENCY, FRACTURES): Vit D, 25-Hydroxy: 25 ng/mL — ABNORMAL LOW (ref 30–100)

## 2016-06-19 LAB — HEMOGLOBIN A1C
Hgb A1c MFr Bld: 5.4 % (ref <5.7)
Mean Plasma Glucose: 108 mg/dL

## 2016-06-19 LAB — TSH: TSH: 4.91 mIU/L — ABNORMAL HIGH

## 2016-06-20 ENCOUNTER — Telehealth: Payer: Self-pay | Admitting: *Deleted

## 2016-06-20 MED ORDER — VITAMIN D (ERGOCALCIFEROL) 1.25 MG (50000 UNIT) PO CAPS: 50000.0000 [IU] | ORAL_CAPSULE | ORAL | 0 refills | Status: DC

## 2016-06-20 NOTE — Telephone Encounter (Signed)
-----   Message from Kem Boroughs, [place] sent at 06/20/2016  8:35 AM EDT ----- Please call pt with labs:  The HIV and Hep C were negative as expected.  The Vit D was still low at 59 - she is taking OTC Vit D - but not sure about your current dose.  Find out what she is doing and can make recommendations. HG AIC is normal. Lipid panel shows elevated total and LDL cholesterol - she can go on a low cholesterol diet.  CMP is normal except for a low calcium - verify that she is taking calcium daily.  The TSH was elevated and this is abnormal for her.  Have her recheck TSH in 2 weeks and if still elevated may need thyroid medication.

## 2016-06-20 NOTE — Telephone Encounter (Signed)
Pt notified in result note.  Closing encounter. 

## 2016-06-20 NOTE — Addendum Note (Signed)
Addended by: Graylon Good on: 06/20/2016 09:58 AM   Modules accepted: Orders

## 2016-06-20 NOTE — Telephone Encounter (Signed)
I have attempted to contact this patient by phone with the following results: left message to return call to [place] at [phone] answering machine (home per [facility]). Name verified on voicemail, advised call was regarding recent labs. [phone] (Home) *Preferred*

## 2016-07-04 ENCOUNTER — Other Ambulatory Visit (INDEPENDENT_AMBULATORY_CARE_PROVIDER_SITE_OTHER): Payer: 59

## 2016-07-04 DIAGNOSIS — R899 Unspecified abnormal finding in specimens from other organs, systems and tissues: Secondary | ICD-10-CM

## 2016-07-05 LAB — TSH: TSH: 2.2 mIU/L

## 2016-09-07 NOTE — Addendum Note (Signed)
Addended by: Zoila Shutter D on: 09/07/2016 12:00 PM   Modules accepted: Orders

## 2016-09-10 ENCOUNTER — Other Ambulatory Visit: Payer: Self-pay

## 2016-09-11 ENCOUNTER — Other Ambulatory Visit (INDEPENDENT_AMBULATORY_CARE_PROVIDER_SITE_OTHER): Payer: 59

## 2016-09-11 DIAGNOSIS — E559 Vitamin D deficiency, unspecified: Secondary | ICD-10-CM

## 2016-09-12 LAB — VITAMIN D 25 HYDROXY (VIT D DEFICIENCY, FRACTURES): Vit D, 25-Hydroxy: 38 ng/mL (ref 30.0–100.0)

## 2017-01-14 DIAGNOSIS — L309 Dermatitis, unspecified: Secondary | ICD-10-CM | POA: Insufficient documentation

## 2017-01-16 ENCOUNTER — Other Ambulatory Visit: Payer: Self-pay | Admitting: Family Medicine

## 2017-01-16 DIAGNOSIS — Z780 Asymptomatic menopausal state: Secondary | ICD-10-CM

## 2017-01-21 ENCOUNTER — Other Ambulatory Visit: Payer: Self-pay | Admitting: Family Medicine

## 2017-01-21 DIAGNOSIS — Z1231 Encounter for screening mammogram for malignant neoplasm of breast: Secondary | ICD-10-CM

## 2017-01-21 DIAGNOSIS — E2839 Other primary ovarian failure: Secondary | ICD-10-CM

## 2017-02-01 DIAGNOSIS — L570 Actinic keratosis: Secondary | ICD-10-CM | POA: Insufficient documentation

## 2017-02-19 ENCOUNTER — Ambulatory Visit
Admission: RE | Admit: 2017-02-19 | Discharge: 2017-02-19 | Disposition: A | Discharge status: Home / Self Care | Payer: 59 | Source: Ambulatory Visit | Attending: Family Medicine | Admitting: Family Medicine

## 2017-02-19 DIAGNOSIS — E2839 Other primary ovarian failure: Secondary | ICD-10-CM

## 2017-02-19 DIAGNOSIS — Z1231 Encounter for screening mammogram for malignant neoplasm of breast: Secondary | ICD-10-CM

## 2018-01-08 ENCOUNTER — Other Ambulatory Visit: Payer: Self-pay | Admitting: Family Medicine

## 2018-01-08 DIAGNOSIS — Z1231 Encounter for screening mammogram for malignant neoplasm of breast: Secondary | ICD-10-CM

## 2018-01-17 DIAGNOSIS — L290 Pruritus ani: Secondary | ICD-10-CM | POA: Insufficient documentation

## 2018-01-17 DIAGNOSIS — F339 Major depressive disorder, recurrent, unspecified: Secondary | ICD-10-CM | POA: Insufficient documentation

## 2018-01-17 DIAGNOSIS — R32 Unspecified urinary incontinence: Secondary | ICD-10-CM | POA: Insufficient documentation

## 2018-02-25 ENCOUNTER — Ambulatory Visit
Admission: RE | Admit: 2018-02-25 | Discharge: 2018-02-25 | Disposition: A | Discharge status: Home / Self Care | Payer: 59 | Source: Ambulatory Visit | Attending: Family Medicine | Admitting: Family Medicine

## 2018-02-25 DIAGNOSIS — Z1231 Encounter for screening mammogram for malignant neoplasm of breast: Secondary | ICD-10-CM

## 2018-05-14 DIAGNOSIS — M722 Plantar fascial fibromatosis: Secondary | ICD-10-CM | POA: Insufficient documentation

## 2018-05-27 ENCOUNTER — Encounter: Payer: Self-pay | Admitting: Physical Therapy

## 2018-05-27 ENCOUNTER — Ambulatory Visit: Payer: 59 | Attending: Orthopedic Surgery | Admitting: Physical Therapy

## 2018-05-27 ENCOUNTER — Other Ambulatory Visit: Payer: Self-pay

## 2018-05-27 DIAGNOSIS — M79672 Pain in left foot: Secondary | ICD-10-CM | POA: Insufficient documentation

## 2018-05-27 DIAGNOSIS — R262 Difficulty in walking, not elsewhere classified: Secondary | ICD-10-CM | POA: Insufficient documentation

## 2018-05-27 DIAGNOSIS — M79671 Pain in right foot: Secondary | ICD-10-CM | POA: Diagnosis present

## 2018-05-27 NOTE — Therapy (Signed)
[place], [place], [address] Phone: [phone]   Fax:  [phone]  Physical Therapy Evaluation  Patient Details  Name: Jacqueline Boone MRN: [id number] Date of Birth: 05-19-1950 Referring Provider (PT): Dr. Doran Durand   Encounter Date: 05/27/2018  PT End of Session - 05/27/18 1656    Visit Number  1    Date for PT Re-Evaluation  07/26/18    PT Start Time  1614    PT Stop Time  1708    PT Time Calculation (min)  54 min    Activity Tolerance  Patient tolerated treatment well    Behavior During Therapy  [facility] for tasks assessed/performed       Past Medical History:  Diagnosis Date  . Allergic rhinitis   . 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 Colon polyps   . Condyloma 1995  . Dermatitis   . Gastritis, chronic   . GERD (gastroesophageal reflux disease)   . Helicobacter pylori gastritis    eradicated  . IBS (irritable bowel syndrome)   . Ledderhose's disease   . PUD (peptic ulcer disease)   . Wrist fracture    left    Past Surgical History:  Procedure Laterality Date  . CERVICAL DISCECTOMY  1996   C4,C5  . COLONOSCOPY  Multiple  . ESOPHAGOGASTRODUODENOSCOPY  Multiple  . TOTAL ABDOMINAL HYSTERECTOMY W/ BILATERAL SALPINGOOPHORECTOMY  1974   with BSO    There were no vitals filed for this visit.   Subjective Assessment - 05/27/18 1615    Subjective  Patient reports that she has had some foot pain for about (fibromatosis) for 12 years, reports that over the past few months she has developed fascitis of the feet with increased pain    Limitations  Walking;Standing    How long can you stand comfortably?  10-20 minutes    How long can you walk comfortably?  limits her distance walking    Patient Stated Goals  have less pain, walk, decrease the spasms     Currently in Pain?  Yes    Pain Score  1     Pain Location  Foot    Pain Orientation  Right;Left    Pain Descriptors / Indicators  Aching;Spasm;Sore    Pain Type  Acute pain    Pain Onset  More than a month ago    Pain Frequency  Intermittent    Aggravating Factors   walking and standing pain can be a 10/10, reports when she has a spasms the pain will be 10/10    Pain Relieving Factors  pain subsides to 0/10 with rest, unsure of why the spasms will hit    Effect of Pain on Daily Activities  limits walking and standing         [facility] PT Assessment - 05/27/18 0001      Assessment   Medical Diagnosis  plantar fibromatosis, and plantar fascitis    Referring Provider (PT)  Dr. Doran Durand    Onset Date/Surgical Date  05/14/18    Prior Therapy  no      Precautions   Precautions  None      Balance Screen   Has the patient fallen in the past 6 months  No    Has the patient had a decrease in activity level because of a fear of falling?   No    Is the patient reluctant to leave their home  because of a fear of falling?   No      Home Environment   Additional Comments  a few steps into the home, does housework      Prior Function   Level of Independence  Independent    Vocation  Part time employment    Vocation Requirements  [employer], some standing, walking and sitting    Leisure  no exercise      ROM / Strength   AROM / PROM / Strength  AROM;Strength      AROM   AROM Assessment Site  Ankle    Right/Left Ankle  Right;Left    Right Ankle Dorsiflexion  0    Right Ankle Plantar Flexion  30    Right Ankle Inversion  20    Right Ankle Eversion  10    Left Ankle Dorsiflexion  0    Left Ankle Plantar Flexion  30    Left Ankle Inversion  20    Left Ankle Eversion  10      Palpation   Palpation comment  on the plantar surface of the right foot she has about 5 nodules, there is one close to the calcaneus that is very tender today., left plantar foot has about 6 nodules all mildly tender, she is very tender at the origin of the PF on the medial calcaneus      Ambulation/Gait    Gait Comments  no device, mild antalgic left, tends to be less toe off as she reports that strettcing the foot hurts, up and down stairs she does flat footed, avoids the stretches                Objective measurements completed on examination: See above findings.      [facility] Adult PT Treatment/Exercise - 05/27/18 0001      Modalities   Modalities  Iontophoresis;Ultrasound      Ultrasound   Ultrasound Location  left plantar foot area    Ultrasound Parameters  100% 3MHz 1.4w/cm2    Ultrasound Goals  Pain      Iontophoresis   Type of Iontophoresis  Dexamethasone    Location  left PF origin    Dose  20mA    Time  4 hour patch             PT Education - 05/27/18 1654    Education Details  seated gastroc stretch with towel    Person(s) Educated  Patient    Methods  Explanation;Demonstration;Handout    Comprehension  Verbalized understanding       PT Short Term Goals - 05/27/18 1700      PT SHORT TERM GOAL #1   Title  independent with initial HEP    Time  2    Period  Weeks    Status  New        PT Long Term Goals - 05/27/18 1700      PT LONG TERM GOAL #1   Title  decrease pain 50%    Time  8    Period  Weeks    Status  New      PT LONG TERM GOAL #2   Title  report able to walk 500 feet wihtout pain > 5/10    Time  8    Period  Weeks    Status  New      PT LONG TERM GOAL #3   Title  increase DF to 6 degrees    Time  8    Period  Weeks    Status  New      PT LONG TERM GOAL #4   Title  report able to stand 15 minutes wihtout pain >4/10    Time  8    Period  Weeks    Status  New             Plan - 05/27/18 1656    Clinical Impression Statement  Patient has fibromatosis of the bilateral plantar fascia, she reports that this has been about 12 years.  She reports that over the past few months she has developed plantar fascitis, left > right.  She has nodules in the fascia of each foot, some nodules are very tender, she is very tender in  the left medial calcaneus area  She reports that any standing or walking will increase pain to 10/10, she is very tight and missing DF, she reports that she does not like to do any stretches due to pain, there fore she limits toe off.    Clinical Presentation  Evolving    Clinical Decision Making  Low    Rehab Potential  Good    PT Frequency  2x / week    PT Duration  8 weeks    PT Treatment/Interventions  ADLs/Self Care Home Management;Iontophoresis 4mg /ml Dexamethasone;Cryotherapy;Electrical Stimulation;Moist Heat;Ultrasound;Therapeutic exercise;Therapeutic activities;Neuromuscular re-education;Patient/family education;Manual techniques    PT Next Visit Plan  see if she feels that today treatment helped, would like to get more DF    Consulted and Agree with Plan of Care  Patient       Patient will benefit from skilled therapeutic intervention in order to improve the following deficits and impairments:  Abnormal gait, Increased fascial restricitons, Pain, Increased muscle spasms, Decreased mobility, Decreased activity tolerance, Decreased range of motion, Impaired flexibility, Difficulty walking  Visit Diagnosis: Pain in left foot - Plan: PT plan of care cert/re-cert  Pain in right foot - Plan: PT plan of care cert/re-cert  Difficulty in walking, not elsewhere classified - Plan: PT plan of care cert/re-cert     Problem List Patient Active Problem List   Diagnosis Date Noted  . Influenza due to influenza A virus 04/22/2013  . Dupuytren's contracture of foot 09/15/2011  . COLONIC POLYPS, ADENOMATOUS, HX OF 12/26/2009  . ALLERGIC RHINITIS 02/12/2007  . GERD 02/12/2007  . IRRITABLE BOWEL SYNDROME 02/12/2007  . MENOPAUSE, SURGICAL 02/12/2007    Sumner Boast., PT 05/27/2018, 5:28 PM  [place] [address] [place], [place], [address] Phone: [phone]   Fax:  [phone]  Name: Jacqueline Boone MRN:  [id number] Date of Birth: January 23, 1951

## 2018-06-02 ENCOUNTER — Ambulatory Visit: Payer: 59 | Admitting: Physical Therapy

## 2018-06-06 ENCOUNTER — Ambulatory Visit: Payer: 59 | Admitting: Physical Therapy

## 2018-06-10 ENCOUNTER — Ambulatory Visit: Payer: 59 | Attending: Orthopedic Surgery | Admitting: Physical Therapy

## 2018-06-10 ENCOUNTER — Encounter: Payer: Self-pay | Admitting: Physical Therapy

## 2018-06-10 DIAGNOSIS — M79671 Pain in right foot: Secondary | ICD-10-CM | POA: Insufficient documentation

## 2018-06-10 DIAGNOSIS — R262 Difficulty in walking, not elsewhere classified: Secondary | ICD-10-CM | POA: Diagnosis present

## 2018-06-10 DIAGNOSIS — M79672 Pain in left foot: Secondary | ICD-10-CM | POA: Diagnosis present

## 2018-06-10 NOTE — Therapy (Signed)
[place], [place], [address] Phone: [phone]   Fax:  [phone]  Physical Therapy Treatment  Patient Details  Name: Jacqueline Boone MRN: [id number] Date of Birth: [birth year] Referring Provider (PT): Dr. Doran Durand   Encounter Date: 06/10/2018  PT End of Session - 06/10/18 1649    Visit Number  2    Date for PT Re-Evaluation  07/26/18    PT Start Time  5681    PT Stop Time  1616    PT Time Calculation (min)  61 min    Activity Tolerance  Patient tolerated treatment well    Behavior During Therapy  [facility] for tasks assessed/performed       Past Medical History:  Diagnosis Date  . Allergic rhinitis   . 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 Colon polyps   . Condyloma 1995  . Dermatitis   . Gastritis, chronic   . GERD (gastroesophageal reflux disease)   . Helicobacter pylori gastritis    eradicated  . IBS (irritable bowel syndrome)   . Ledderhose's disease   . PUD (peptic ulcer disease)   . Wrist fracture    left    Past Surgical History:  Procedure Laterality Date  . CERVICAL DISCECTOMY  1996   C4,C5  . COLONOSCOPY  Multiple  . ESOPHAGOGASTRODUODENOSCOPY  Multiple  . TOTAL ABDOMINAL HYSTERECTOMY W/ BILATERAL SALPINGOOPHORECTOMY  1974   with BSO    There were no vitals filed for this visit.  Subjective Assessment - 06/10/18 1559    Subjective  Patient reports that the last treatment helped some, but after a few days it was the same, she does report she had a virus and stopped doing any stretches    Currently in Pain?  Yes    Pain Score  2     Pain Location  Foot    Pain Orientation  Right;Left    Aggravating Factors   walking and standing                       [facility] Adult PT Treatment/Exercise - 06/10/18 0001      Modalities   Modalities  Electrical Stimulation;Cryotherapy;Iontophoresis;Ultrasound      Cryotherapy   Number  Minutes Cryotherapy  15 Minutes    Cryotherapy Location  Ankle    Type of Cryotherapy  Ice pack      Electrical Stimulation   Electrical Stimulation Location  bilateral PF    Electrical Stimulation Action  premod    Electrical Stimulation Parameters  supine    Electrical Stimulation Goals  Pain      Ultrasound   Ultrasound Location  bilateral PR area    Ultrasound Parameters  100% 3MHz    Ultrasound Goals  Pain      Iontophoresis   Type of Iontophoresis  Dexamethasone    Location  left PF origin    Dose  61m    Time  4 hour patch      Manual Therapy   Manual Therapy  Passive ROM    Passive ROM  passive stretch for DF               PT Short Term Goals - 06/10/18 1651      PT SHORT TERM GOAL #1   Title  independent with initial HEP    Status  Partially Met        PT  Long Term Goals - 05/27/18 1700      PT LONG TERM GOAL #1   Title  decrease pain 50%    Time  8    Period  Weeks    Status  New      PT LONG TERM GOAL #2   Title  report able to walk 500 feet wihtout pain > 5/10    Time  8    Period  Weeks    Status  New      PT LONG TERM GOAL #3   Title  increase DF to 6 degrees    Time  8    Period  Weeks    Status  New      PT LONG TERM GOAL #4   Title  report able to stand 15 minutes wihtout pain >4/10    Time  8    Period  Weeks    Status  New            Plan - 06/10/18 1649    Clinical Impression Statement  Patient had some decreased pain with the last treatment, shereports that she got sick and stopped the stretches that I gave her.  She is still very tender int he PF area bilaterally, calves are very tight    PT Next Visit Plan  stretch and see if what we are doing is helping    Consulted and Agree with Plan of Care  Patient       Patient will benefit from skilled therapeutic intervention in order to improve the following deficits and impairments:  Abnormal gait, Increased fascial restricitons, Pain, Increased muscle spasms,  Decreased mobility, Decreased activity tolerance, Decreased range of motion, Impaired flexibility, Difficulty walking  Visit Diagnosis: Pain in left foot  Pain in right foot  Difficulty in walking, not elsewhere classified     Problem List Patient Active Problem List   Diagnosis Date Noted  . Influenza due to influenza A virus 04/22/2013  . Dupuytren's contracture of foot 09/15/2011  . COLONIC POLYPS, ADENOMATOUS, HX OF 12/26/2009  . ALLERGIC RHINITIS 02/12/2007  . GERD 02/12/2007  . IRRITABLE BOWEL SYNDROME 02/12/2007  . MENOPAUSE, SURGICAL 02/12/2007    Sumner Boast., PT 06/10/2018, 4:52 PM  [place] [address] [place], [place], [address] Phone: [phone]   Fax:  [phone]  Name: Jacqueline Boone MRN: [id number] Date of Birth: [birth year]

## 2018-06-17 ENCOUNTER — Ambulatory Visit: Payer: 59 | Admitting: Physical Therapy

## 2018-06-17 ENCOUNTER — Encounter: Payer: Self-pay | Admitting: Physical Therapy

## 2018-06-17 DIAGNOSIS — M79672 Pain in left foot: Secondary | ICD-10-CM | POA: Diagnosis not present

## 2018-06-17 DIAGNOSIS — R262 Difficulty in walking, not elsewhere classified: Secondary | ICD-10-CM

## 2018-06-17 DIAGNOSIS — M79671 Pain in right foot: Secondary | ICD-10-CM

## 2018-06-17 NOTE — Therapy (Signed)
[place], [place], [address] Phone: [phone]   Fax:  [phone]  Physical Therapy Treatment  Patient Details  Name: Jacqueline Boone MRN: [id number] Date of Birth: 01/12/1951 Referring Provider (PT): Dr. Doran Durand   Encounter Date: 06/17/2018  PT End of Session - 06/17/18 1553    Visit Number  3    Date for PT Re-Evaluation  07/26/18    PT Start Time  1525    PT Stop Time  1620    PT Time Calculation (min)  55 min    Activity Tolerance  Patient tolerated treatment well    Behavior During Therapy  [facility] for tasks assessed/performed       Past Medical History:  Diagnosis Date  . Allergic rhinitis   . 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 Colon polyps   . Condyloma 1995  . Dermatitis   . Gastritis, chronic   . GERD (gastroesophageal reflux disease)   . Helicobacter pylori gastritis    eradicated  . IBS (irritable bowel syndrome)   . Ledderhose's disease   . PUD (peptic ulcer disease)   . Wrist fracture    left    Past Surgical History:  Procedure Laterality Date  . CERVICAL DISCECTOMY  1996   C4,C5  . COLONOSCOPY  Multiple  . ESOPHAGOGASTRODUODENOSCOPY  Multiple  . TOTAL ABDOMINAL HYSTERECTOMY W/ BILATERAL SALPINGOOPHORECTOMY  1974   with BSO    There were no vitals filed for this visit.  Subjective Assessment - 06/17/18 1524    Subjective  Patient reports that she felt really pretty good after that last treatment.    Currently in Pain?  Yes    Pain Score  2     Pain Location  Foot    Pain Orientation  Right;Left    Aggravating Factors   standing                       [facility] Adult PT Treatment/Exercise - 06/17/18 0001      Modalities   Modalities  Electrical Stimulation;Cryotherapy;Iontophoresis;Ultrasound      Cryotherapy   Number Minutes Cryotherapy  15 Minutes    Cryotherapy Location  Ankle    Type of Cryotherapy  Ice  pack      Electrical Stimulation   Electrical Stimulation Location  bilateral PF    Electrical Stimulation Action  premod    Electrical Stimulation Parameters  supine    Electrical Stimulation Goals  Pain      Ultrasound   Ultrasound Location  bilateral PF area    Ultrasound Parameters  100% 3MHz 1.3 w/cm2    Ultrasound Goals  Pain      Iontophoresis   Type of Iontophoresis  Dexamethasone    Location  left PF origin    Dose  54m    Time  4 hour patch #3      Manual Therapy   Manual Therapy  Passive ROM    Passive ROM  passive stretch for DF               PT Short Term Goals - 06/10/18 1651      PT SHORT TERM GOAL #1   Title  independent with initial HEP    Status  Partially Met        PT Long Term Goals - 06/17/18 1556      PT LONG TERM GOAL #1  Title  decrease pain 50%    Status  Partially Met      PT LONG TERM GOAL #2   Title  report able to walk 500 feet wihtout pain > 5/10    Status  Partially Met      PT LONG TERM GOAL #3   Title  increase DF to 6 degrees    Status  Partially Met      PT LONG TERM GOAL #4   Title  report able to stand 15 minutes wihtout pain >4/10    Status  Partially Met            Plan - 06/17/18 1553    Clinical Impression Statement  Patient reports that she has been feeling a lot better and that she felt like she wanted to walk for exercise but she thought she should not push it.  The knots on the right foot seem to be smaller and less painful.  She does have difficulty relaxing and allowing stretches    PT Next Visit Plan  keep up with current treatment, may add exercise    Consulted and Agree with Plan of Care  Patient       Patient will benefit from skilled therapeutic intervention in order to improve the following deficits and impairments:  Abnormal gait, Increased fascial restricitons, Pain, Increased muscle spasms, Decreased mobility, Decreased activity tolerance, Decreased range of motion, Impaired  flexibility, Difficulty walking  Visit Diagnosis: Pain in left foot  Pain in right foot  Difficulty in walking, not elsewhere classified     Problem List Patient Active Problem List   Diagnosis Date Noted  . Influenza due to influenza A virus 04/22/2013  . Dupuytren's contracture of foot 09/15/2011  . COLONIC POLYPS, ADENOMATOUS, HX OF 12/26/2009  . ALLERGIC RHINITIS 02/12/2007  . GERD 02/12/2007  . IRRITABLE BOWEL SYNDROME 02/12/2007  . MENOPAUSE, SURGICAL 02/12/2007    Sumner Boast., PT 06/17/2018, 3:57 PM  [place] [address] [place], [place], [address] Phone: [phone]   Fax:  [phone]  Name: Jacqueline Boone MRN: [id number] Date of Birth: April 29, 1950

## 2018-06-24 ENCOUNTER — Encounter: Payer: Self-pay | Admitting: Physical Therapy

## 2018-06-24 ENCOUNTER — Other Ambulatory Visit: Payer: Self-pay

## 2018-06-24 ENCOUNTER — Ambulatory Visit: Payer: 59 | Admitting: Physical Therapy

## 2018-06-24 DIAGNOSIS — M79672 Pain in left foot: Secondary | ICD-10-CM

## 2018-06-24 DIAGNOSIS — R262 Difficulty in walking, not elsewhere classified: Secondary | ICD-10-CM

## 2018-06-24 DIAGNOSIS — M79671 Pain in right foot: Secondary | ICD-10-CM

## 2018-06-24 NOTE — Therapy (Signed)
[place], [place], [address] Phone: [phone]   Fax:  [phone]  Physical Therapy Treatment  Patient Details  Name: Jacqueline Boone MRN: [id number] Date of Birth: [birth year] Referring Provider (PT): Dr. Doran Durand   Encounter Date: 06/24/2018  PT End of Session - 06/24/18 1616    Visit Number  4    Date for PT Re-Evaluation  07/26/18    PT Start Time  8676    PT Stop Time  1620    PT Time Calculation (min)  45 min    Activity Tolerance  Patient tolerated treatment well    Behavior During Therapy  [facility] for tasks assessed/performed       Past Medical History:  Diagnosis Date  . Allergic rhinitis   . 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 Colon polyps   . Condyloma 1995  . Dermatitis   . Gastritis, chronic   . GERD (gastroesophageal reflux disease)   . Helicobacter pylori gastritis    eradicated  . IBS (irritable bowel syndrome)   . Ledderhose's disease   . PUD (peptic ulcer disease)   . Wrist fracture    left    Past Surgical History:  Procedure Laterality Date  . CERVICAL DISCECTOMY  1996   C4,C5  . COLONOSCOPY  Multiple  . ESOPHAGOGASTRODUODENOSCOPY  Multiple  . TOTAL ABDOMINAL HYSTERECTOMY W/ BILATERAL SALPINGOOPHORECTOMY  1974   with BSO    There were no vitals filed for this visit.  Subjective Assessment - 06/24/18 1537    Subjective  Patient reports that she was worse after the last treatment    Currently in Pain?  Yes    Pain Score  2     Pain Location  Foot    Pain Orientation  Right;Left    Pain Descriptors / Indicators  Aching    Aggravating Factors   unsrure of why it felt worse after the last treatment pain a 6/10                       [facility] Adult PT Treatment/Exercise - 06/24/18 0001      Exercises   Exercises  Ankle      Modalities   Modalities  Moist Heat      Moist Heat Therapy   Number Minutes Moist  Heat  15 Minutes    Moist Heat Location  Ankle      Electrical Stimulation   Electrical Stimulation Location  bilateral PF    Electrical Stimulation Action  premod    Electrical Stimulation Parameters  supine    Electrical Stimulation Goals  Pain      Ultrasound   Ultrasound Location  bilateral PF area    Ultrasound Parameters  100% 3MHz 1.2w/cm2    Ultrasound Goals  Pain      Ankle Exercises: Seated   ABC's  2 reps    Ankle Circles/Pumps  20 reps      Ankle Exercises: Supine   T-Band  all ankle motions red tband               PT Short Term Goals - 06/10/18 1651      PT SHORT TERM GOAL #1   Title  independent with initial HEP    Status  Partially Met        PT Long Term Goals - 06/24/18 1630      PT LONG  TERM GOAL #1   Title  decrease pain 50%    Status  Partially Met      PT LONG TERM GOAL #2   Title  report able to walk 500 feet wihtout pain > 5/10    Status  Partially Met      PT LONG TERM GOAL #3   Title  increase DF to 6 degrees    Status  Achieved      PT LONG TERM GOAL #4   Title  report able to stand 15 minutes wihtout pain >4/10    Status  Partially Met            Plan - 06/24/18 1617    Clinical Impression Statement  Patient had some increased pain after the last treatment, she is not sure why but reports that she feels we "over stretched it".  Added some exercises today, will monitor that, needing cues to do the correct movements    PT Next Visit Plan  see if the pain resolves and how the exercises did    Consulted and Agree with Plan of Care  Patient       Patient will benefit from skilled therapeutic intervention in order to improve the following deficits and impairments:  Abnormal gait, Increased fascial restricitons, Pain, Increased muscle spasms, Decreased mobility, Decreased activity tolerance, Decreased range of motion, Impaired flexibility, Difficulty walking  Visit Diagnosis: Pain in left foot  Pain in right  foot  Difficulty in walking, not elsewhere classified     Problem List Patient Active Problem List   Diagnosis Date Noted  . Influenza due to influenza A virus 04/22/2013  . Dupuytren's contracture of foot 09/15/2011  . COLONIC POLYPS, ADENOMATOUS, HX OF 12/26/2009  . ALLERGIC RHINITIS 02/12/2007  . GERD 02/12/2007  . IRRITABLE BOWEL SYNDROME 02/12/2007  . MENOPAUSE, SURGICAL 02/12/2007    Sumner Boast., PT 06/24/2018, 4:31 PM  [place] [address] [place], [place], [address] Phone: [phone]   Fax:  [phone]  Name: Jacqueline Boone MRN: [id number] Date of Birth: [birth year]

## 2018-07-01 ENCOUNTER — Ambulatory Visit: Payer: 59 | Admitting: Physical Therapy

## 2018-07-08 ENCOUNTER — Ambulatory Visit: Payer: 59 | Admitting: Physical Therapy

## 2018-07-22 ENCOUNTER — Other Ambulatory Visit: Payer: Self-pay

## 2018-07-22 ENCOUNTER — Encounter: Payer: Self-pay | Admitting: Physical Therapy

## 2018-07-22 ENCOUNTER — Ambulatory Visit: Payer: 59 | Attending: Orthopedic Surgery | Admitting: Physical Therapy

## 2018-07-22 DIAGNOSIS — R262 Difficulty in walking, not elsewhere classified: Secondary | ICD-10-CM

## 2018-07-22 DIAGNOSIS — M79671 Pain in right foot: Secondary | ICD-10-CM | POA: Diagnosis present

## 2018-07-22 DIAGNOSIS — M79672 Pain in left foot: Secondary | ICD-10-CM | POA: Diagnosis not present

## 2018-07-22 NOTE — Therapy (Signed)
[place] [place], [place], [address] Phone: [phone]   Fax:  [phone]  Physical Therapy Treatment  Patient Details  Name: Jacqueline Boone MRN: [id number] Date of Birth: 1951/03/22 Referring Provider (PT): Dr. Doran Durand   Encounter Date: 07/22/2018  PT End of Session - 07/22/18 1136    Visit Number  5    Date for PT Re-Evaluation  08/25/18    PT Start Time  1103    PT Stop Time  1150    PT Time Calculation (min)  47 min    Activity Tolerance  Patient tolerated treatment well    Behavior During Therapy  [facility] for tasks assessed/performed       Past Medical History:  Diagnosis Date  . Allergic rhinitis   . 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 Colon polyps   . Condyloma 1995  . Dermatitis   . Gastritis, chronic   . GERD (gastroesophageal reflux disease)   . Helicobacter pylori gastritis    eradicated  . IBS (irritable bowel syndrome)   . Ledderhose's disease   . PUD (peptic ulcer disease)   . Wrist fracture    left    Past Surgical History:  Procedure Laterality Date  . CERVICAL DISCECTOMY  1996   C4,C5  . COLONOSCOPY  Multiple  . ESOPHAGOGASTRODUODENOSCOPY  Multiple  . TOTAL ABDOMINAL HYSTERECTOMY W/ BILATERAL SALPINGOOPHORECTOMY  1974   with BSO    There were no vitals filed for this visit.  Subjective Assessment - 07/22/18 1133    Subjective  Patient reports that the last treatment really helped, she reports that she has resumed walking and has had much less pain.      Currently in Pain?  Yes    Pain Score  1     Pain Location  Foot    Pain Orientation  Left;Right    Aggravating Factors   treatment has helped    Pain Relieving Factors  treatment helped         [facility] PT Assessment - 07/22/18 0001      Assessment   Medical Diagnosis  plantar fibromatosis, and plantar fascitis    Referring Provider (PT)  Dr. Lorenza Chick Adult PT Treatment/Exercise - 07/22/18 0001      Modalities   Modalities  Moist Heat      Moist Heat Therapy   Number Minutes Moist Heat  15 Minutes    Moist Heat Location  Ankle      Electrical Stimulation   Electrical Stimulation Location  bilateral PF    Electrical Stimulation Action  premod    Electrical Stimulation Parameters  supine    Electrical Stimulation Goals  Pain      Ultrasound   Ultrasound Location  bilateral PF area    Ultrasound Parameters  100% 3MHz 1.3 w/cm2    Ultrasound Goals  Pain      Manual Therapy   Manual Therapy  Joint mobilization;Passive ROM    Joint Mobilization  metatarsal and phalanges mobilization    Passive ROM  passive stretch for DF, gentle STM               PT Short Term Goals - 06/10/18 1651      PT SHORT TERM GOAL #1   Title  independent with initial HEP  Status  Partially Met        PT Long Term Goals - 07/22/18 1139      PT LONG TERM GOAL #1   Title  decrease pain 50%    Status  Partially Met      PT LONG TERM GOAL #2   Title  report able to walk 500 feet wihtout pain > 5/10    Status  Achieved      PT LONG TERM GOAL #3   Title  increase DF to 6 degrees    Status  Achieved      PT LONG TERM GOAL #4   Title  report able to stand 15 minutes wihtout pain >4/10    Status  Partially Met            Plan - 07/22/18 1137    Clinical Impression Statement  Patient reports after the last treatment she had much less pain and was feeling better, she does report also trying to resume walking and trying to build slowly up to a mile.  We have decreased the number of visits due to Covid 19.  She also has not had to work so she has been off of her feet more.    PT Next Visit Plan  will continue seeing 1x/week to see if we can help her pain and her returning to walking and to work    [employer] and Agree with Plan of Care  Patient       Patient will benefit from skilled therapeutic  intervention in order to improve the following deficits and impairments:  Abnormal gait, Increased fascial restricitons, Pain, Increased muscle spasms, Decreased mobility, Decreased activity tolerance, Decreased range of motion, Impaired flexibility, Difficulty walking  Visit Diagnosis: Pain in left foot - Plan: PT plan of care cert/re-cert  Pain in right foot - Plan: PT plan of care cert/re-cert  Difficulty in walking, not elsewhere classified - Plan: PT plan of care cert/re-cert     Problem List Patient Active Problem List   Diagnosis Date Noted  . Influenza due to influenza A virus 04/22/2013  . Dupuytren's contracture of foot 09/15/2011  . COLONIC POLYPS, ADENOMATOUS, HX OF 12/26/2009  . ALLERGIC RHINITIS 02/12/2007  . GERD 02/12/2007  . IRRITABLE BOWEL SYNDROME 02/12/2007  . MENOPAUSE, SURGICAL 02/12/2007    Sumner Boast., PT 07/22/2018, 11:41 AM  [address] [place], [place], [address] Phone: [phone]   Fax:  [phone]  Name: ANGEL HOBDY MRN: [id number] Date of Birth: June 25, 1950

## 2018-07-29 ENCOUNTER — Other Ambulatory Visit: Payer: Self-pay

## 2018-07-29 ENCOUNTER — Ambulatory Visit: Payer: 59 | Admitting: Physical Therapy

## 2018-07-29 ENCOUNTER — Encounter: Payer: Self-pay | Admitting: Physical Therapy

## 2018-07-29 DIAGNOSIS — M79672 Pain in left foot: Secondary | ICD-10-CM

## 2018-07-29 DIAGNOSIS — R262 Difficulty in walking, not elsewhere classified: Secondary | ICD-10-CM

## 2018-07-29 DIAGNOSIS — M79671 Pain in right foot: Secondary | ICD-10-CM

## 2018-07-29 NOTE — Therapy (Signed)
[place], [place], [address] Phone: [phone]   Fax:  [phone]  Physical Therapy Treatment  Patient Details  Name: Jacqueline Boone MRN: [id number] Date of Birth: 1951-02-16 Referring Provider (PT): Dr. Doran Durand   Encounter Date: 07/29/2018  PT End of Session - 07/29/18 1338    Visit Number  6    Date for PT Re-Evaluation  08/25/18    PT Start Time  2706    PT Stop Time  1340    PT Time Calculation (min)  35 min    Activity Tolerance  Patient tolerated treatment well    Behavior During Therapy  [facility] for tasks assessed/performed       Past Medical History:  Diagnosis Date  . Allergic rhinitis   . 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 Colon polyps   . Condyloma 1995  . Dermatitis   . Gastritis, chronic   . GERD (gastroesophageal reflux disease)   . Helicobacter pylori gastritis    eradicated  . IBS (irritable bowel syndrome)   . Ledderhose's disease   . PUD (peptic ulcer disease)   . Wrist fracture    left    Past Surgical History:  Procedure Laterality Date  . CERVICAL DISCECTOMY  1996   C4,C5  . COLONOSCOPY  Multiple  . ESOPHAGOGASTRODUODENOSCOPY  Multiple  . TOTAL ABDOMINAL HYSTERECTOMY W/ BILATERAL SALPINGOOPHORECTOMY  1974   with BSO    There were no vitals filed for this visit.  Subjective Assessment - 07/29/18 1336    Subjective  Patient reports that she has continued to walk but reports that she had some increased pain after the last treatment, "overstimulated"    Currently in Pain?  Yes    Pain Score  1     Pain Location  Foot    Pain Orientation  Right;Left                       [facility] Adult PT Treatment/Exercise - 07/29/18 0001      Ultrasound   Ultrasound Location  bilateral PF area    Ultrasound Parameters  100% 3MHz    Ultrasound Goals  Pain      Manual Therapy   Manual Therapy  Joint  mobilization;Passive ROM    Joint Mobilization  metatarsal and phalanges mobilization    Passive ROM  passive stretch for DF, gentle STM      Ankle Exercises: Stretches   Plantar Fascia Stretch  3 reps;10 seconds;Limitations    Plantar Fascia Stretch Limitations  gentle    Gastroc Stretch  20 seconds;3 reps      Ankle Exercises: Seated   ABC's  2 reps    Towel Crunch  5 reps      Ankle Exercises: Supine   T-Band  all ankle motions red tband               PT Short Term Goals - 06/10/18 1651      PT SHORT TERM GOAL #1   Title  independent with initial HEP    Status  Partially Met        PT Long Term Goals - 07/29/18 1341      PT LONG TERM GOAL #1   Title  decrease pain 50%    Status  Partially Met      PT LONG TERM GOAL #2   Title  report able to  walk 500 feet wihtout pain > 5/10    Status  Achieved      PT LONG TERM GOAL #3   Title  increase DF to 6 degrees    Status  Achieved      PT LONG TERM GOAL #4   Title  report able to stand 15 minutes wihtout pain >4/10    Status  Partially Met            Plan - 07/29/18 1340    Clinical Impression Statement  Reports a little more pain after the last visit felt "over stimulated".  did not do the estim today.  She is walking with no limp.  And has returned to her walking routine    PT Next Visit Plan  will skip next weeks treatment and see how she is doing    Consulted and Agree with Plan of Care  Patient       Patient will benefit from skilled therapeutic intervention in order to improve the following deficits and impairments:  Abnormal gait, Increased fascial restricitons, Pain, Increased muscle spasms, Decreased mobility, Decreased activity tolerance, Decreased range of motion, Impaired flexibility, Difficulty walking  Visit Diagnosis: Pain in left foot  Pain in right foot  Difficulty in walking, not elsewhere classified     Problem List Patient Active Problem List   Diagnosis Date Noted  .  Influenza due to influenza A virus 04/22/2013  . Dupuytren's contracture of foot 09/15/2011  . COLONIC POLYPS, ADENOMATOUS, HX OF 12/26/2009  . ALLERGIC RHINITIS 02/12/2007  . GERD 02/12/2007  . IRRITABLE BOWEL SYNDROME 02/12/2007  . MENOPAUSE, SURGICAL 02/12/2007    Sumner Boast., PT 07/29/2018, 1:43 PM  [place] [place], [place], [address] Phone: [phone]   Fax:  [phone]  Name: Jacqueline Boone MRN: [id number] Date of Birth: 07/02/1950

## 2018-08-27 ENCOUNTER — Encounter: Payer: Self-pay | Admitting: Internal Medicine

## 2018-10-23 ENCOUNTER — Other Ambulatory Visit: Payer: Self-pay

## 2018-10-27 ENCOUNTER — Other Ambulatory Visit: Payer: Self-pay

## 2018-10-27 ENCOUNTER — Encounter: Payer: Self-pay | Admitting: Certified Nurse Midwife

## 2018-10-27 ENCOUNTER — Ambulatory Visit (INDEPENDENT_AMBULATORY_CARE_PROVIDER_SITE_OTHER): Payer: 59 | Admitting: Certified Nurse Midwife

## 2018-10-27 VITALS — BP 120/62 | HR 86 | Temp 97.3°F | Resp 14 | Ht 63.0 in | Wt 191.4 lb

## 2018-10-27 DIAGNOSIS — Z0000 Encounter for general adult medical examination without abnormal findings: Secondary | ICD-10-CM

## 2018-10-27 DIAGNOSIS — E559 Vitamin D deficiency, unspecified: Secondary | ICD-10-CM

## 2018-10-27 DIAGNOSIS — Z01419 Encounter for gynecological examination (general) (routine) without abnormal findings: Secondary | ICD-10-CM

## 2018-10-27 NOTE — Progress Notes (Signed)
68 y.o. G0P0000 Married  Caucasian Fe here for annual exam. Post menopausal, no ERT. PCP had her stop ERT due to age. She has hot flashes occasionally, but feel due to weight change too. Denies vaginal dryness or bleeding. Request screening labs today. Has mammogram scheduled and aware colonoscopy is due. No health issues today.  Patient's last menstrual period was 04/09/1972 (approximate).          Sexually active: No.  The current method of family planning is status post hysterectomy.    Exercising: No.  The patient does not participate in regular exercise at present. Smoker:  Former smoker   Review of Systems  Constitutional: Negative.   HENT: Negative.   Eyes: Negative.   Respiratory: Negative.   Cardiovascular: Negative.   Gastrointestinal: Negative.   Genitourinary: Negative.   Musculoskeletal: Negative.   Skin: Negative.   Neurological: Negative.   Endo/Heme/Allergies: Negative.   Psychiatric/Behavioral: Negative.     Health Maintenance: Pap:  02-09-15 negative, HR HPV negative  History of Abnormal Pap: no MMG:  02-25-18 density B/BIRADS 1 negative  Self Breast exams: no Colonoscopy:  08-18-13 hyperplastic polyp- repeat in 5 years, patient aware she is due  BMD:   02-19-17 normal  TDaP:  2014 Shingles: completed x2 Pneumonia: 2018 Hep C and HIV: 2018 both negative  Labs: discuss with provider    reports that she has quit smoking. She has never used smokeless tobacco. She reports current alcohol use of about 6.0 standard drinks of alcohol per week. She reports that she does not use drugs.  Past Medical History:  Diagnosis Date  . Allergic rhinitis   . 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 Colon polyps   . Condyloma 1995  . Dermatitis   . Gastritis, chronic   . GERD (gastroesophageal reflux disease)   . Helicobacter pylori gastritis    eradicated  . IBS (irritable bowel syndrome)   . Ledderhose's disease   . PUD (peptic ulcer disease)    . Wrist fracture    left    Past Surgical History:  Procedure Laterality Date  . CERVICAL DISCECTOMY  1996   C4,C5  . COLONOSCOPY  Multiple  . ESOPHAGOGASTRODUODENOSCOPY  Multiple  . TOTAL ABDOMINAL HYSTERECTOMY W/ BILATERAL SALPINGOOPHORECTOMY  1974   with BSO    No current outpatient medications on file.   No current facility-administered medications for this visit.     Family History  Problem Relation Age of Onset  . Hodgkin's lymphoma Father   . Glaucoma Brother        blind  . Mental retardation Brother   . Colon cancer Neg Hx   . Pancreatic cancer Neg Hx   . Rectal cancer Neg Hx   . Stomach cancer Neg Hx     ROS:  Pertinent items are noted in HPI.  Otherwise, a comprehensive ROS was negative.  Exam:   BP 120/62 (BP Location: Right Arm, Patient Position: Sitting, Cuff Size: Normal)   Pulse 86   Temp (!) 97.3 F (36.3 C) (Temporal)   Resp 14   Ht 5\' 3"  (1.6 m)   Wt 191 lb 6.4 oz (86.8 kg)   LMP 04/09/1972 (Approximate)   BMI 33.90 kg/m  Height: 5\' 3"  (160 cm) Ht Readings from Last 3 Encounters:  10/27/18 5\' 3"  (1.6 m)  06/18/16 5\' 4"  (1.626 m)  02/09/15 5' 3.25" (1.607 m)    General appearance: alert, cooperative and appears stated age Head: Normocephalic, without  obvious abnormality, atraumatic Neck: no adenopathy, supple, symmetrical, trachea midline and thyroid normal to inspection and palpation Lungs: clear to auscultation bilaterally Breasts: normal appearance, no masses or tenderness, No nipple retraction or dimpling, No nipple discharge or bleeding, No axillary or supraclavicular adenopathy Heart: regular rate and rhythm Abdomen: soft, non-tender; no masses,  no organomegaly Extremities: extremities normal, atraumatic, no cyanosis or edema Skin: Skin color, texture, turgor normal. No rashes or lesions Lymph nodes: Cervical, supraclavicular, and axillary nodes normal. No abnormal inguinal nodes palpated Neurologic: Grossly normal   Pelvic:  External genitalia:  no lesions              Urethra:  normal appearing urethra with no masses, tenderness or lesions              Bartholin's and Skene's: normal                 Vagina: normal appearing vagina with normal color and discharge, no lesions              Cervix: absent              Pap taken: No. Bimanual Exam:  Uterus:  uterus absent              Adnexa: no mass, fullness, tenderness and adnexa bilateral surgically absent               Rectovaginal: Confirms               Anus:  normal sphincter tone, no lesions  Chaperone present: yes  A:  Well Woman with normal exam  Post menopausal no ERT  Weight gain  Colonoscopy due, history of polyps  Screening labs  P:   Reviewed health and wellness pertinent to exam  Discussed if vaginal dryness occurs, needs to advise, can try coconut or Olive oil if needed.  Stressed importance of good diet and weight for healthy lifestyle.  Patient plans to schedule  Labs: CBC, TSH, CMP,Lipid panel, Vitamin D  Pap smear: no  counseled on breast self exam, mammography screening, feminine hygiene, adequate intake of calcium and vitamin D, diet and exercise, Kegel's exercises return annually or prn  An After Visit Summary was printed and given to the patient.

## 2018-10-29 ENCOUNTER — Other Ambulatory Visit: Payer: Self-pay

## 2018-10-29 ENCOUNTER — Other Ambulatory Visit: Payer: Self-pay | Admitting: Certified Nurse Midwife

## 2018-10-29 DIAGNOSIS — R6889 Other general symptoms and signs: Secondary | ICD-10-CM

## 2018-10-29 DIAGNOSIS — E559 Vitamin D deficiency, unspecified: Secondary | ICD-10-CM

## 2018-10-29 LAB — COMPREHENSIVE METABOLIC PANEL
ALT: 12 IU/L (ref 0–32)
AST: 16 IU/L (ref 0–40)
Albumin/Globulin Ratio: 1.3 (ref 1.2–2.2)
Albumin: 4.1 g/dL (ref 3.8–4.8)
Alkaline Phosphatase: 102 IU/L (ref 39–117)
BUN/Creatinine Ratio: 21 (ref 12–28)
BUN: 16 mg/dL (ref 8–27)
Bilirubin Total: 0.2 mg/dL (ref 0.0–1.2)
CO2: 21 mmol/L (ref 20–29)
Calcium: 8.7 mg/dL (ref 8.7–10.3)
Chloride: 103 mmol/L (ref 96–106)
Creatinine, Ser: 0.76 mg/dL (ref 0.57–1.00)
GFR calc Af Amer: 94 mL/min/{1.73_m2} (ref >59)
GFR calc non Af Amer: 81 mL/min/{1.73_m2} (ref >59)
Globulin, Total: 3.1 g/dL (ref 1.5–4.5)
Glucose: 82 mg/dL (ref 65–99)
Potassium: 4.3 mmol/L (ref 3.5–5.2)
Sodium: 142 mmol/L (ref 134–144)
Total Protein: 7.2 g/dL (ref 6.0–8.5)

## 2018-10-29 LAB — LIPID PANEL
Chol/HDL Ratio: 3.2 ratio (ref 0.0–4.4)
Cholesterol, Total: 201 mg/dL — ABNORMAL HIGH (ref 100–199)
HDL: 63 mg/dL (ref >39)
LDL Calculated: 90 mg/dL (ref 0–99)
Triglycerides: 242 mg/dL — ABNORMAL HIGH (ref 0–149)
VLDL Cholesterol Cal: 48 mg/dL — ABNORMAL HIGH (ref 5–40)

## 2018-10-29 LAB — CBC
Hematocrit: 44.1 % (ref 34.0–46.6)
Hemoglobin: 14.3 g/dL (ref 11.1–15.9)
MCH: 30.4 pg (ref 26.6–33.0)
MCHC: 32.4 g/dL (ref 31.5–35.7)
MCV: 94 fL (ref 79–97)
Platelets: 205 10*3/uL (ref 150–450)
RBC: 4.7 x10E6/uL (ref 3.77–5.28)
RDW: 13.1 % (ref 11.7–15.4)
WBC: 6.9 10*3/uL (ref 3.4–10.8)

## 2018-10-29 LAB — TSH: TSH: 3.32 u[IU]/mL (ref 0.450–4.500)

## 2018-10-29 LAB — VITAMIN D 25 HYDROXY (VIT D DEFICIENCY, FRACTURES): Vit D, 25-Hydroxy: 14.3 ng/mL — ABNORMAL LOW (ref 30.0–100.0)

## 2018-10-29 MED ORDER — VITAMIN D (ERGOCALCIFEROL) 1.25 MG (50000 UNIT) PO CAPS: 50000.0000 [IU] | ORAL_CAPSULE | ORAL | 0 refills | Status: DC

## 2019-01-29 ENCOUNTER — Other Ambulatory Visit: Payer: Self-pay | Admitting: Family Medicine

## 2019-01-29 DIAGNOSIS — Z1231 Encounter for screening mammogram for malignant neoplasm of breast: Secondary | ICD-10-CM

## 2019-02-10 ENCOUNTER — Telehealth: Payer: Self-pay

## 2019-02-10 NOTE — Telephone Encounter (Signed)
Patient was suppose to comeback in 30mths for follow up labwork. I called patient to schedule her & she stated that she didn't want to comeback to have it rechecked. She states she will see her pcp at some point. Patient is aware of importance of having follow up & declined. Routing to [employer], cnm. Please close encounter when done.

## 2019-02-17 NOTE — Telephone Encounter (Signed)
Agreeable to plan.

## 2019-02-25 DIAGNOSIS — Z87891 Personal history of nicotine dependence: Secondary | ICD-10-CM | POA: Insufficient documentation

## 2019-02-25 DIAGNOSIS — J449 Chronic obstructive pulmonary disease, unspecified: Secondary | ICD-10-CM | POA: Insufficient documentation

## 2019-03-02 ENCOUNTER — Encounter: Payer: Self-pay | Admitting: Internal Medicine

## 2019-03-23 ENCOUNTER — Encounter: Payer: 59 | Admitting: Internal Medicine

## 2019-03-24 ENCOUNTER — Ambulatory Visit
Admission: RE | Admit: 2019-03-24 | Discharge: 2019-03-24 | Disposition: A | Discharge status: Home / Self Care | Payer: 59 | Source: Ambulatory Visit | Attending: Family Medicine | Admitting: Family Medicine

## 2019-03-24 ENCOUNTER — Other Ambulatory Visit: Payer: Self-pay

## 2019-03-24 DIAGNOSIS — Z1231 Encounter for screening mammogram for malignant neoplasm of breast: Secondary | ICD-10-CM

## 2019-04-14 ENCOUNTER — Ambulatory Visit: Payer: 59 | Attending: Internal Medicine

## 2019-04-14 DIAGNOSIS — Z20822 Contact with and (suspected) exposure to covid-19: Secondary | ICD-10-CM

## 2019-04-17 LAB — NOVEL CORONAVIRUS, NAA: SARS-CoV-2, NAA: NOT DETECTED

## 2019-06-17 ENCOUNTER — Other Ambulatory Visit: Payer: Self-pay | Admitting: Family Medicine

## 2019-06-17 DIAGNOSIS — Z87891 Personal history of nicotine dependence: Secondary | ICD-10-CM

## 2019-06-30 ENCOUNTER — Encounter: Payer: Self-pay | Admitting: Certified Nurse Midwife

## 2019-07-22 ENCOUNTER — Ambulatory Visit
Admission: RE | Admit: 2019-07-22 | Discharge: 2019-07-22 | Disposition: A | Discharge status: Home / Self Care | Payer: 59 | Source: Ambulatory Visit | Attending: Family Medicine | Admitting: Family Medicine

## 2019-07-22 DIAGNOSIS — Z87891 Personal history of nicotine dependence: Secondary | ICD-10-CM

## 2019-07-27 DIAGNOSIS — R918 Other nonspecific abnormal finding of lung field: Secondary | ICD-10-CM | POA: Insufficient documentation

## 2019-08-27 ENCOUNTER — Other Ambulatory Visit: Payer: Self-pay

## 2019-08-27 ENCOUNTER — Encounter: Payer: Self-pay | Admitting: Internal Medicine

## 2019-08-27 ENCOUNTER — Ambulatory Visit: Payer: 59 | Admitting: Internal Medicine

## 2019-08-27 VITALS — BP 140/80 | HR 89 | Temp 98.1°F | Ht 63.5 in | Wt 198.2 lb

## 2019-08-27 DIAGNOSIS — R05 Cough: Secondary | ICD-10-CM

## 2019-08-27 DIAGNOSIS — J310 Chronic rhinitis: Secondary | ICD-10-CM

## 2019-08-27 DIAGNOSIS — J432 Centrilobular emphysema: Secondary | ICD-10-CM

## 2019-08-27 DIAGNOSIS — R053 Chronic cough: Secondary | ICD-10-CM

## 2019-08-27 MED ORDER — OMEPRAZOLE 40 MG PO CPDR: 40.0000 mg | DELAYED_RELEASE_CAPSULE | Freq: Every day | ORAL | 5 refills | Status: DC

## 2019-08-27 MED ORDER — FLUTICASONE PROPIONATE 50 MCG/ACT NA SUSP: 1.0000 | Freq: Every day | NASAL | 5 refills | Status: AC

## 2019-08-27 NOTE — Progress Notes (Signed)
[name]    JA:[id number]    04/24/1950  Primary Care Physician:Briscoe, Jannifer Rodney, MD  Referring Physician: Katherina Mires, MD [address] [place],  [place] [address] Reason for Consultation: congestion Date of Consultation: 08/27/2019  Chief complaint:   Chief Complaint  Patient presents with  . Consult    cough and chest/nasal congestion.  Abnormal CT scan     HPI: Jacqueline STURMS  Is a 69 y.o. woman with 50 pack year smoking history who presents for new patient evaluation. Notes worsening congestion in her chest for the past year. She does get short of breath occasionally with overexertion. She can do stairs and walk. No limitations with ADLs, just moves a bit more slowly than she used to a year ago. No hospitalizations for breathing.  Started on Breo 2 weeks ago by PCP. Not sure if it's helping. Before that was started on arnuity and also wasn't sure if that helped. Has not taken a rescue inhaler.   Her biggest concern is the nasal congestion for which she isn't taking anything. +runny nose +post nasal drip. No itchy/watery eyes. Chronic for about a year has tried an OTC nasal spray which cleared up her sinuses. No seasonal variation.  Had H. Pylori with stomach ulcers 12 years ago, was treated. Thinks she had pneumonia once 30 years ago. Recurrent bronchitis treated with antibiotics.  Currently coughing - yellow phlegm. No wheezing.   Drinks 10-12 beers/week, usually in batches with multiple beers a day.   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 Concern for poor air quality in basement.  Exposures: lives at home with her husband, International aid/development worker. Smoking history: 50 pack years, quit 2010.   Social History   Occupational History    Employer: UNEMPLOYED  Tobacco Use  . Smoking status: Former Smoker    Packs/day: 1.00    Years: 50.00    Pack years: 50.00    Types: Cigarettes    Quit date:  12/09/2008    Years since quitting: 10.7  . Smokeless tobacco: Never Used  Substance and Sexual Activity  . Alcohol use: Yes    Alcohol/week: 6.0 standard drinks    Types: 6 Standard drinks or equivalent per week    Comment: social  . Drug use: No  . Sexual activity: Yes    Birth control/protection: Surgical    Comment: hysterectomy    Relevant family history:  Family History  Problem Relation Age of Onset  . Hodgkin's lymphoma Father   . Emphysema Father   . Glaucoma Brother        blind  . Mental retardation Brother   . Emphysema Mother   . Colon cancer Neg Hx   . Pancreatic cancer Neg Hx   . Rectal cancer Neg Hx   . Stomach cancer Neg Hx     Past Medical History:  Diagnosis Date  . Allergic rhinitis   . 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 Colon polyps   . Condyloma 1995  . Dermatitis   . Gastritis, chronic   . GERD (gastroesophageal reflux disease)   . H pylori ulcer   . H. pylori infection   . Helicobacter pylori gastritis    eradicated  . IBS (irritable bowel syndrome)   . Ledderhose's disease   . PUD (peptic ulcer disease)   . Wrist fracture    left  Past Surgical History:  Procedure Laterality Date  . CERVICAL DISCECTOMY  1996   C4,C5  . COLONOSCOPY  Multiple  . ESOPHAGOGASTRODUODENOSCOPY  Multiple  . TOTAL ABDOMINAL HYSTERECTOMY W/ BILATERAL SALPINGOOPHORECTOMY  1974   with BSO   Physical Exam: Blood pressure 140/80, pulse 89, temperature 98.1 F (36.7 C), temperature source Temporal, height 5' 3.5" (1.613 m), weight 198 lb 3.2 oz (89.9 kg), last menstrual period 04/09/1972, SpO2 90 %. Gen:      No acute distress ENT:  no nasal polyps, mucus membranes moist, +cobblestoning in oropharynx Lungs:    Diminished breath sounds, No increased respiratory effort, symmetric chest wall excursion, clear to auscultation bilaterally, no wheezes or crackles CV:         Regular rate and rhythm; no murmurs, rubs, or gallops.  No  pedal edema Abd:      + bowel sounds; soft, non-tender; no distension MSK: no acute synovitis of DIP or PIP joints, no mechanics hands.  Skin:      Warm and dry; no rashes Neuro: normal speech, no focal facial asymmetry Psych: alert and oriented x3, normal mood and affect   Data Reviewed/Medical Decision Making:  Independent interpretation of tests: Imaging: . Review of patient's CT Chest images revealed centrilobular emphysema, lower lobe predominant bronchiectasis and mild traction bronchiectasis. Few patchy ggos The patient's images have been independently reviewed by me.    PFTs: None on filke  Labs:  Lab Results  Component Value Date   WBC 6.9 10/27/2018   HGB 14.3 10/27/2018   HCT 44.1 10/27/2018   MCV 94 10/27/2018   PLT 205 10/27/2018   Lab Results  Component Value Date   NA 142 10/27/2018   K 4.3 10/27/2018   CL 103 10/27/2018   CO2 21 10/27/2018     Immunization status:  Immunization History  Administered Date(s) Administered  . Influenza Whole 01/08/2007, 02/06/2010  . Influenza, High Dose Seasonal PF 12/18/2018  . Moderna SARS-COVID-2 Vaccination 05/30/2019, 06/27/2019  . Pneumococcal Conjugate-13 01/14/2017  . Pneumococcal Polysaccharide-23 02/12/2007  . Td 02/12/2007  . Tdap 05/12/2012  . Zoster Recombinat (Shingrix) 01/17/2018, 05/05/2018    . I reviewed prior external note(s) from PCP . I reviewed the result(s) of the labs and imaging as noted above.  . I have ordered PFTs  Assessment:  Chronic Rhinitis Centrilobular Emphysema Chronic Cough - likely related to GERD   Plan/Recommendations: Her biggest concern is her congestion and cough.  She has not had significant improvement with trials of breo or arnuity.  I suspect reflux is playing into her symptoms especially nocturnally so we'll start PPI for this and monitor symptoms  Will start flonase for chronic rhinitis.   I did discuss trial of albuterol inhaler but she is denying dyspnea and  I'm concerned she might be minimizing her symptoms somewhat. Will obtain full set of PFTs to further characterize her lung disease. If her PFTs show restriction or she has no improvement or persistent ambulatory desaturation, I would consider repeat the CT scan high res ILD protocol at next visit (closer to 3 month mark rather than 6 month mark.)  I did ambulate her on room air and she desaturated to 88%.  We discussed oxygen therapy and she really would rather not start at this time.    We discussed disease management and progression at length today.  For COPD we discussed at length  I spent 60 minutes in the care of this patient today including pre-charting, chart review, review of  results, face-to-face care, coordination of care and communication with consultants etc.).  [phone]  15-29 minutes or Straightforward MDM  95188 30-44 minutes or Low level MDM  41660 45-59 minutes or Moderate level MDM  63016 60-74 minutes or High level MDM   Return to Care: Return in about 8 weeks (around 10/22/2019). She will follow up with APP and then see me again.   Lenice Llamas, MD Pul[facility] [place]  CC: Katherina Mires, MD

## 2019-08-27 NOTE — Patient Instructions (Addendum)
The patient should have follow up scheduled in 2 months.   Prior to next visit patient should have: Full set of PFTs  I think cough/congestion is probably a combination of post nasal drip and maybe some reflux.  Start taking omeprazole 1 pill once a day. Start taking fluticasone nasal spray.  Flonase - 1 spray on each side of your nose twice a day for first week, then 1 spray on each side.   Instructions for use:  If you also use a saline nasal spray or rinse, use that first.  Position the head with the chin slightly tucked. Use the right hand to spray into the left nostril and the right hand to spray into the left nostril.   Point the bottle away from the septum of your nose (cartilage that divides the two sides of your nose).   Hold the nostril closed on the opposite side from where you will spray  Spray once and gently sniff to pull the medicine into the higher parts of your nose.  Don't sniff too hard as the medicine will drain down the back of your throat instead.  Repeat with a second spray on the same side if prescribed.  Repeat on the other side of your nose.  What is GERD? Gastroesophageal reflux disease (GERD) is gastroesophageal reflux diseasewhich occurs when the lower esophageal sphincter (LES) opens spontaneously, for varying periods of time, or does not close properly and stomach contents rise up into the esophagus. GER is also called acid reflux or acid regurgitation, because digestive juices--called acids--rise up with the food. The esophagus is the tube that carries food from the mouth to the stomach. The LES is a ring of muscle at the bottom of the esophagus that acts like a valve between the esophagus and stomach.  When acid reflux occurs, food or fluid can be tasted in the back of the mouth. When refluxed stomach acid touches the lining of the esophagus it may cause a burning sensation in the chest or throat called heartburn or acid indigestion. Occasional reflux is  common. Persistent reflux that occurs more than twice a week is considered GERD, and it can eventually lead to more serious health problems. People of all ages can have GERD. Studies have shown that GERD may worsen or contribute to asthma, chronic cough, and pulmonary fibrosis.   What are the symptoms of GERD? The main symptom of GERD in adults is frequent heartburn, also called acid indigestion--burning-type pain in the lower part of the mid-chest, behind the breast bone, and in the mid-abdomen.  Not all reflux is acidic in nature, and many patients don't have heart burn at all. Sometimes it feels like a cough (either dry or with mucus), choking sensation, asthma, shortness of breath, waking up at night, frequent throat clearing, or trouble swallowing.    What causes GERD? The reason some people develop GERD is still unclear. However, research shows that in people with GERD, the LES relaxes while the rest of the esophagus is working. Anatomical abnormalities such as a hiatal hernia may also contribute to GERD. A hiatal hernia occurs when the upper part of the stomach and the LES move above the diaphragm, the muscle wall that separates the stomach from the chest. Normally, the diaphragm helps the LES keep acid from rising up into the esophagus. When a hiatal hernia is present, acid reflux can occur more easily. A hiatal hernia can occur in people of any age and is most often a normal finding  in otherwise healthy people over age 38. Most of the time, a hiatal hernia produces no symptoms.   Other factors that may contribute to GERD include - Obesity or recent weight gain - Pregnancy  - Smoking  - Diet - Certain medications  Common foods that can worsen reflux symptoms include: - carbonated beverages - artificial sweeteners - citrus fruits  - chocolate  - drinks with caffeine or alcohol  - fatty and fried foods  - garlic and onions  - mint flavorings  - spicy foods  - tomato-based foods, like  spaghetti sauce, salsa, chili, and pizza   Lifestyle Changes If you smoke, stop.  Avoid foods and beverages that worsen symptoms (see above.) Lose weight if needed.  Eat small, frequent meals.  Wear loose-fitting clothes.  Avoid lying down for 3 hours after a meal.  Raise the head of your bed 6 to 8 inches by securing wood blocks under the bedposts. Just using extra pillows will not help, but using a wedge-shaped pillow may be helpful.  Medications  H2 blockers, such as cimetidine (Tagamet HB), famotidine (Pepcid AC), nizatidine (Axid AR), and ranitidine (Zantac 75), decrease acid production. They are available in prescription strength and over-the-counter strength. These drugs provide short-term relief and are effective for about half of those who have GERD symptoms.  Proton pump inhibitors include omeprazole (Prilosec, Zegerid), lansoprazole (Prevacid), pantoprazole (Protonix), rabeprazole (Aciphex), and esomeprazole (Nexium), which are available by prescription. Prilosec is also available in over-the-counter strength. Proton pump inhibitors are more effective than H2 blockers and can relieve symptoms and heal the esophageal lining in almost everyone who has GERD.  Because drugs work in different ways, combinations of medications may help control symptoms. People who get heartburn after eating may take both antacids and H2 blockers. The antacids work first to neutralize the acid in the stomach, and then the H2 blockers act on acid production. By the time the antacid stops working, the H2 blocker will have stopped acid production. Your health care provider is the best source of information about how to use medications for GERD.   Points to Remember 1. You can have GERD without having heartburn. Your symptoms could include a dry cough, asthma symptoms, or trouble swallowing.  2. Taking medications daily as prescribed is important in controlling you symptoms.  Sometimes it can take up to 8  weeks to fully achieve the effects of the medications prescribed.  3. Coughing related to GERD can be difficult to treat and is very frustrating!  However, it is important to stick with these medications and lifestyle modifications before pursuing more aggressive or invasive test and treatments.

## 2019-12-22 ENCOUNTER — Encounter: Payer: Self-pay | Admitting: Gastroenterology

## 2020-02-02 ENCOUNTER — Other Ambulatory Visit: Payer: Self-pay

## 2020-02-02 ENCOUNTER — Ambulatory Visit (AMBULATORY_SURGERY_CENTER): Payer: Self-pay | Admitting: *Deleted

## 2020-02-02 ENCOUNTER — Encounter: Payer: Self-pay | Admitting: Gastroenterology

## 2020-02-02 VITALS — Ht 63.5 in | Wt 200.0 lb

## 2020-02-02 DIAGNOSIS — Z86010 Personal history of colonic polyps: Secondary | ICD-10-CM

## 2020-02-02 MED ORDER — PLENVU 140 G PO SOLR: 1.0000 | Freq: Once | ORAL | 0 refills | Status: AC

## 2020-02-02 NOTE — Progress Notes (Signed)
Patient is here in-person for PV. Patient denies any allergies to eggs or soy. Patient denies any problems with anesthesia/sedation. Patient denies any oxygen use at home. Patient denies taking any diet/weight loss medications or blood thinners. Patient is not being treated for MRSA or C-diff. Patient is aware of our care-partner policy and [id number] safety protocol. EMMI education assisgned to the patient for the procedure, sent to MyChart.   COVID-19 vaccines completed on 06/2019, per patient.   Prep Prescription coupon given to the patient.

## 2020-02-09 ENCOUNTER — Telehealth: Payer: Self-pay

## 2020-02-09 NOTE — Telephone Encounter (Signed)
Jacqueline Boone this pt was added to Dr Ardis Hughs schedule for colon but he is a Programme researcher, broadcasting/film/video pt.  She needs an appt with Dr Carlean Purl or app.  Can you please call her? I cancelled the colon already.

## 2020-02-09 NOTE — Telephone Encounter (Signed)
Patient notified of need for PFT and office visit for procedure here at the [facility].  Patient does not wish to proceed at this time.  She will call back if she wants to reschedule.

## 2020-02-09 NOTE — Telephone Encounter (Signed)
Called patient to advise and reschedule procedure to an OV currently on 02/12/2020 patient is requesting to speak with nurse in reference to message below from [facility]

## 2020-02-09 NOTE — Telephone Encounter (Signed)
-----   Message from Milus Banister, MD sent at 02/09/2020  8:08 AM EDT ----- Regarding: RE: [place] pt Djibril Glogowski, Please cancel her upcoming colonoscopy. Instead she needs an OV with extender or Dr. Carlean Purl (she is actually his patient and I don't see any charting that she has changed care to me).  Thanks  Gretchen Short, thank you as always for your attention on these matters.  ----- Message ----- From: Osvaldo Angst, CRNA Sent: 02/09/2020   7:53 AM EDT To: Milus Banister, MD Subject: [facility] pt                                         Dr. Ardis Hughs,  This pt is scheduled with you for a screening colonoscopy on Nov 8.  She was seen by PV not a OV.  She was seen in May 2021 by a pulmonologist due to O2 SAT's  of 90% while ambulating and diagnosed with centrilobar emphysema; supplemental O2 was suggested but pt refused.  PFT's were ordered but never done.  Please review her history; it would be best to obtain the PFT's before accepting her for the scheduled elective outpatient procedure.  Thanks much,  Osvaldo Angst

## 2020-02-12 ENCOUNTER — Ambulatory Visit: Payer: 59 | Admitting: Gastroenterology

## 2020-02-15 ENCOUNTER — Encounter: Payer: 59 | Admitting: Gastroenterology

## 2020-04-05 ENCOUNTER — Other Ambulatory Visit: Payer: Self-pay | Admitting: Family Medicine

## 2020-04-05 DIAGNOSIS — Z1231 Encounter for screening mammogram for malignant neoplasm of breast: Secondary | ICD-10-CM

## 2020-04-21 ENCOUNTER — Other Ambulatory Visit: Payer: Self-pay | Admitting: Internal Medicine

## 2020-04-21 DIAGNOSIS — J310 Chronic rhinitis: Secondary | ICD-10-CM

## 2020-04-21 DIAGNOSIS — R053 Chronic cough: Secondary | ICD-10-CM

## 2020-05-17 ENCOUNTER — Ambulatory Visit
Admission: RE | Admit: 2020-05-17 | Discharge: 2020-05-17 | Disposition: A | Discharge status: Home / Self Care | Payer: 59 | Source: Ambulatory Visit | Attending: Family Medicine | Admitting: Family Medicine

## 2020-05-17 ENCOUNTER — Other Ambulatory Visit: Payer: Self-pay

## 2020-05-17 DIAGNOSIS — Z1231 Encounter for screening mammogram for malignant neoplasm of breast: Secondary | ICD-10-CM

## 2021-02-03 ENCOUNTER — Ambulatory Visit
Admission: RE | Admit: 2021-02-03 | Discharge: 2021-02-03 | Disposition: A | Discharge status: Home / Self Care | Payer: No Typology Code available for payment source | Source: Ambulatory Visit | Attending: Nurse Practitioner | Admitting: Nurse Practitioner

## 2021-02-03 ENCOUNTER — Other Ambulatory Visit: Payer: Self-pay | Admitting: Nurse Practitioner

## 2021-02-03 DIAGNOSIS — R52 Pain, unspecified: Secondary | ICD-10-CM

## 2021-02-03 DIAGNOSIS — R609 Edema, unspecified: Secondary | ICD-10-CM

## 2021-02-24 ENCOUNTER — Other Ambulatory Visit: Payer: Self-pay | Admitting: Family Medicine

## 2021-02-24 DIAGNOSIS — Z1231 Encounter for screening mammogram for malignant neoplasm of breast: Secondary | ICD-10-CM

## 2021-03-31 ENCOUNTER — Telehealth: Payer: Self-pay | Admitting: *Deleted

## 2021-03-31 NOTE — Telephone Encounter (Signed)
ATC x1, LVM to return call to schedule PFT and OV with Dr. Shearon Stalls.  When patient returns call, please schedule above.

## 2021-05-01 ENCOUNTER — Encounter: Payer: Self-pay | Admitting: Internal Medicine

## 2021-05-10 ENCOUNTER — Encounter: Payer: Self-pay | Admitting: *Deleted

## 2021-05-10 ENCOUNTER — Telehealth: Payer: Self-pay | Admitting: *Deleted

## 2021-05-10 NOTE — Telephone Encounter (Signed)
-----   Message from Spero Geralds, MD sent at 03/06/2021 11:03 AM EST ----- Regarding: overdue follow up Hi Jacqueline Boone,  She is overdue for follow up and never got scheduled for PFTs. Could you please call her and get her scheduled?  Thanks! ND

## 2021-05-10 NOTE — Telephone Encounter (Signed)
ATC x1, LVM to return call to schedule OV.  When patient returns call please schedule patient for OV and PFTs.

## 2021-05-23 ENCOUNTER — Ambulatory Visit: Payer: 59

## 2021-05-30 ENCOUNTER — Ambulatory Visit
Admission: RE | Admit: 2021-05-30 | Discharge: 2021-05-30 | Disposition: A | Discharge status: Home / Self Care | Payer: 59 | Source: Ambulatory Visit | Attending: Family Medicine | Admitting: Family Medicine

## 2021-05-30 DIAGNOSIS — Z1231 Encounter for screening mammogram for malignant neoplasm of breast: Secondary | ICD-10-CM

## 2021-06-06 ENCOUNTER — Ambulatory Visit (AMBULATORY_SURGERY_CENTER): Payer: 59 | Admitting: *Deleted

## 2021-06-06 ENCOUNTER — Other Ambulatory Visit: Payer: Self-pay

## 2021-06-06 ENCOUNTER — Encounter: Payer: Self-pay | Admitting: Internal Medicine

## 2021-06-06 VITALS — Ht 64.0 in | Wt 180.0 lb

## 2021-06-06 DIAGNOSIS — Z86010 Personal history of colonic polyps: Secondary | ICD-10-CM

## 2021-06-06 MED ORDER — NA SULFATE-K SULFATE-MG SULF 17.5-3.13-1.6 GM/177ML PO SOLN: 1.0000 | Freq: Once | ORAL | 0 refills | Status: AC

## 2021-06-06 NOTE — Progress Notes (Signed)

## 2021-06-20 ENCOUNTER — Encounter: Payer: Self-pay | Admitting: Internal Medicine

## 2021-06-20 ENCOUNTER — Ambulatory Visit (AMBULATORY_SURGERY_CENTER): Payer: 59 | Admitting: Internal Medicine

## 2021-06-20 ENCOUNTER — Other Ambulatory Visit: Payer: Self-pay

## 2021-06-20 VITALS — BP 124/57 | HR 60 | Temp 97.3°F | Resp 15 | Ht 64.25 in | Wt 180.0 lb

## 2021-06-20 DIAGNOSIS — D123 Benign neoplasm of transverse colon: Secondary | ICD-10-CM

## 2021-06-20 DIAGNOSIS — D125 Benign neoplasm of sigmoid colon: Secondary | ICD-10-CM

## 2021-06-20 DIAGNOSIS — L290 Pruritus ani: Secondary | ICD-10-CM

## 2021-06-20 DIAGNOSIS — Z860100 Personal history of colon polyps, unspecified: Secondary | ICD-10-CM

## 2021-06-20 DIAGNOSIS — K635 Polyp of colon: Secondary | ICD-10-CM

## 2021-06-20 DIAGNOSIS — D124 Benign neoplasm of descending colon: Secondary | ICD-10-CM

## 2021-06-20 DIAGNOSIS — D128 Benign neoplasm of rectum: Secondary | ICD-10-CM

## 2021-06-20 DIAGNOSIS — K621 Rectal polyp: Secondary | ICD-10-CM

## 2021-06-20 DIAGNOSIS — D120 Benign neoplasm of cecum: Secondary | ICD-10-CM

## 2021-06-20 DIAGNOSIS — L309 Dermatitis, unspecified: Secondary | ICD-10-CM

## 2021-06-20 MED ORDER — NYSTATIN-TRIAMCINOLONE 100000-0.1 UNIT/GM-% EX OINT: 1.0000 "application " | TOPICAL_OINTMENT | Freq: Two times a day (BID) | CUTANEOUS | 0 refills | Status: AC

## 2021-06-20 MED ORDER — SODIUM CHLORIDE 0.9 % IV SOLN: 500.0000 mL | INTRAVENOUS | Status: DC

## 2021-06-20 NOTE — Progress Notes (Signed)
[facility] History and Physical ? ? ?Primary Care Physician:  Jacqueline Mires, MD ? ? ?Reason for Procedure:   Hx colon polyps ? ?Plan:    colonoscopy ? ? ? ? ?HPI: Jacqueline Boone is a 71 y.o. female her for polyp surveillance ?Hx large ceccal and other right sided- hyperplastic polyps and at least one adenoma since 2005 ?08/2013 - 5 polyps  3 hyperplastic at splenic flexure and 2 in sigmoid - ? ?Also w/ perianal itching intermittently ? ?Past Medical History:  ?Diagnosis Date  ? Allergic rhinitis   ? Allergy   ? Benign neoplasm of colon 2006  ? Adenomas, cigarette adenomas  ? Colon polyps   ? Condyloma 1995  ? COPD (chronic obstructive pulmonary disease) ([place])   ? Dermatitis   ? Gastritis, chronic   ? GERD (gastroesophageal reflux disease)   ? H pylori ulcer   ? H. pylori infection   ? Helicobacter pylori gastritis   ? eradicated  ? IBS (irritable bowel syndrome)   ? Ledderhose's disease   ? PUD (peptic ulcer disease)   ? Wrist fracture   ? left  ? ? ?Past Surgical History:  ?Procedure Laterality Date  ? CERVICAL DISCECTOMY  1996  ? C4,C5  ? COLONOSCOPY  Multiple  ? last 08/18/13 Jacqueline Boone-polyps  ? ESOPHAGOGASTRODUODENOSCOPY  Multiple  ? POLYPECTOMY    ? TOTAL ABDOMINAL HYSTERECTOMY W/ BILATERAL SALPINGOOPHORECTOMY  1974  ? with BSO  ? ? ?Prior to Admission medications   ?Medication Sig Start Date End Date Taking? Authorizing Provider  ?Ascorbic Acid (VITAMIN C PO) Take by mouth daily. Take one [place] VITAMIN TABLET   Yes [provider]  ?Cholecalciferol (VITAMIN D3 PO) Take 2,000 mg by mouth daily.   Yes [provider]  ?ibuprofen (ADVIL) 200 MG tablet Take 200 mg by mouth every 6 (six) hours as needed. 2 as needed for pain   Yes [provider]  ?omeprazole (PRILOSEC) 40 MG capsule TAKE 1 CAPSULE(40 MG) BY MOUTH DAILY ?Patient taking differently: as needed. 04/21/20  Yes Spero Geralds, MD  ?fluticasone (FLONASE) 50 MCG/ACT nasal spray Place 1 spray into both nostrils  daily. ?Patient taking differently: Place 1 spray into both nostrils as needed. 08/27/19   Spero Geralds, MD  ?fluticasone furoate-vilanterol (BREO ELLIPTA) 100-25 MCG/ACT AEPB Breo Ellipta 100 mcg-25 mcg/dose powder for inhalation ? INHALE 1 PUFF INTO THE LUNGS DAILY ?Patient not taking: Reported on 06/06/2021    [provider]  ?triamcinolone cream (KENALOG) 0.1 % Apply 1 application topically as needed.    [provider]  ? ? ?Current Outpatient Medications  ?Medication Sig Dispense Refill  ? Ascorbic Acid (VITAMIN C PO) Take by mouth daily. Take one [employer] VITAMIN TABLET    ? Cholecalciferol (VITAMIN D3 PO) Take 2,000 mg by mouth daily.    ? ibuprofen (ADVIL) 200 MG tablet Take 200 mg by mouth every 6 (six) hours as needed. 2 as needed for pain    ? omeprazole (PRILOSEC) 40 MG capsule TAKE 1 CAPSULE(40 MG) BY MOUTH DAILY (Patient taking differently: as needed.) 30 capsule 4  ? fluticasone (FLONASE) 50 MCG/ACT nasal spray Place 1 spray into both nostrils daily. (Patient taking differently: Place 1 spray into both nostrils as needed.) 16 g 5  ? fluticasone furoate-vilanterol (BREO ELLIPTA) 100-25 MCG/ACT AEPB Breo Ellipta 100 mcg-25 mcg/dose powder for inhalation ? INHALE 1 PUFF INTO THE LUNGS DAILY (Patient not taking: Reported on 06/06/2021)    ? triamcinolone cream (  KENALOG) 0.1 % Apply 1 application topically as needed.    ? ?Current Facility-Administered Medications  ?Medication Dose Route Frequency Provider Last Rate Last Admin  ? 0.9 %  sodium chloride infusion  500 mL Intravenous Continuous Gatha Mayer, MD      ? ? ?Allergies as of 06/20/2021  ? (No Known Allergies)  ? ? ?Family History  ?Problem Relation Age of Onset  ? Hodgkin's lymphoma Father   ? Emphysema Father   ? Glaucoma Brother   ?     blind  ? Mental retardation Brother   ? Emphysema Mother   ? Colon cancer Neg Hx   ? Pancreatic cancer Neg Hx   ? Rectal cancer Neg Hx   ? Stomach cancer Neg Hx   ? Colon polyps Neg Hx   ?  Esophageal cancer Neg Hx   ? ? ?Social History  ? ?Socioeconomic History  ? Marital status: Married  ?  Spouse name: Not on file  ? Number of children: 0  ? Years of education: Not on file  ? Highest education level: Not on file  ?Occupational History  ?  Employer: UNEMPLOYED  ?Tobacco Use  ? Smoking status: Former  ?  Packs/day: 1.00  ?  Years: 50.00  ?  Pack years: 50.00  ?  Types: Cigarettes  ?  Quit date: 12/09/2008  ?  Years since quitting: 12.5  ?  Passive exposure: Never  ? Smokeless tobacco: Never  ?Vaping Use  ? Vaping Use: Never used  ?Substance and Sexual Activity  ? Alcohol use: Yes  ?  Alcohol/week: 8.0 standard drinks  ?  Types: 8 Cans of beer per week  ?  Comment: social  ? Drug use: No  ? Sexual activity: Yes  ?  Birth control/protection: Surgical  ?  Comment: hysterectomy  ?Other Topics Concern  ? Not on file  ?Social History Narrative  ? Married  ? no kids  ? Patient is a former smoker.    ? Alcohol Use - yes  ? Daily Caffeine Use  ? Works part-time at [employer]  ?   ?   ?   ? ?Social Determinants of Health  ? ?Financial Resource Strain: Not on file  ?Food Insecurity: Not on file  ?Transportation Needs: Not on file  ?Physical Activity: Not on file  ?Stress: Not on file  ?Social Connections: Not on file  ?Intimate Partner Violence: Not on file  ? ? ?Review of Systems: ? ?All other review of systems negative except as mentioned in the HPI. ? ?Physical Exam: ?Vital signs ?BP (!) 145/70   Pulse 62   Temp (!) 97.3 ?F (36.3 ?C) (Temporal)   Resp 16   Ht 5' 4.25" (1.632 m)   Wt 180 lb (81.6 kg)   LMP 04/09/1972 (Approximate)   SpO2 99%   BMI 30.66 kg/m?  ? ?General:   Alert,  Well-developed, well-nourished, pleasant and cooperative in NAD ?Lungs:  Clear throughout to auscultation.   ?Heart:  Regular rate and rhythm; no murmurs, clicks, rubs,  or gallops. ?Abdomen:  Soft, nontender and nondistended. Normal bowel sounds.   ?Neuro/Psych:  Alert and cooperative. Normal mood and affect. A and O x  3 ? ? ?'@Jacqueline Boone'$  E. Jacqueline Handley, MD, Jacqueline Boone ?[facility] ?[phone] (pager) ?06/20/2021 8:42 AM@ ? ?

## 2021-06-20 NOTE — Patient Instructions (Addendum)
Twenty-two (22) polyps removed! All look benign. ?That was all I could do today. You have multiple other tiny ones and will need to return within 1 year. ?Looks like you have a polyposis syndrome. I will explain more after I review pathology. ? ?There is a rash around the anus - Rx sent for this. ? ?I will let you know pathology results and when to have another routine colonoscopy by mail and/or My Chart. ? ?I appreciate the opportunity to care for you. ?Gatha Mayer, MD, [facility] ? ? ? ?YOU HAD AN ENDOSCOPIC PROCEDURE TODAY AT [place]:   Refer to the procedure report that was given to you for any specific questions about what was found during the examination.  If the procedure report does not answer your questions, please call your gastroenterologist to clarify.  If you requested that your care partner not be given the details of your procedure findings, then the procedure report has been included in a sealed envelope for you to review at your convenience later. ? ?YOU SHOULD EXPECT: Some feelings of bloating in the abdomen. Passage of more gas than usual.  Walking can help get rid of the air that was put into your GI tract during the procedure and reduce the bloating. If you had a lower endoscopy (such as a colonoscopy or flexible sigmoidoscopy) you may notice spotting of blood in your stool or on the toilet paper. If you underwent a bowel prep for your procedure, you may not have a normal bowel movement for a few days. ? ?Please Note:  You might notice some irritation and congestion in your nose or some drainage.  This is from the oxygen used during your procedure.  There is no need for concern and it should clear up in a day or so. ? ?SYMPTOMS TO REPORT IMMEDIATELY: ? ?Following lower endoscopy (colonoscopy or flexible sigmoidoscopy): ? Excessive amounts of blood in the stool ? Significant tenderness or worsening of abdominal pains ? Swelling of the abdomen that is new, acute ? Fever of 100?F  or higher ? ?For urgent or emergent issues, a gastroenterologist can be reached at any hour by calling [phone]. ?Do not use MyChart messaging for urgent concerns.  ? ? ?DIET:  We do recommend a small meal at first, but then you may proceed to your regular diet.  Drink plenty of fluids but you should avoid alcoholic beverages for 24 hours. ? ?ACTIVITY:  You should plan to take it easy for the rest of today and you should NOT DRIVE or use heavy machinery until tomorrow (because of the sedation medicines used during the test).   ? ?FOLLOW UP: ?Our staff will call the number listed on your records 48-72 hours following your procedure to check on you and address any questions or concerns that you may have regarding the information given to you following your procedure. If we do not reach you, we will leave a message.  We will attempt to reach you two times.  During this call, we will ask if you have developed any symptoms of COVID 19. If you develop any symptoms (ie: fever, flu-like symptoms, shortness of breath, cough etc.) before then, please call [phone].  If you test positive for Covid 19 in the 2 weeks post procedure, please call and report this information to [place].   ? ?If any biopsies were taken you will be contacted by phone or by letter within the next 1-3 weeks.  Please call us at [phone] if you have not heard about the biopsies in 3 weeks.  ? ? ?SIGNATURES/CONFIDENTIALITY: ?You and/or your care partner have signed paperwork which will be entered into your electronic medical record.  These signatures attest to the fact that that the information above on your After Visit Summary has been reviewed and is understood.  Full responsibility of the confidentiality of this discharge information lies with you and/or your care-partner.  ?

## 2021-06-20 NOTE — Progress Notes (Signed)
Pt's states no medical or surgical changes since previsit or office visit. 

## 2021-06-20 NOTE — Progress Notes (Signed)
Report given to PACU, vss 

## 2021-06-20 NOTE — Op Note (Addendum)
[facility] ?Patient Name: Jacqueline Boone ?Procedure Date: 06/20/2021 8:37 AM ?MRN: [id number] ?Endoscopist: Gatha Mayer , MD ?Age: 71 ?Referring MD:  ?Date of Birth: [birth year] ?Gender: Female ?Account #: [id number] ?Procedure:                Colonoscopy ?Indications:              High risk colon cancer surveillance: Personal  ?                          history of colonic polyps, Last colonoscopy: 2015Hx  ?                          large ceccal and other right sided- hyperplastic  ?                          polyps and at least one adenoma since 2005 ?                          08/2013 - 5 polyps 3 hyperplastic at splenic flexure  ?                          and 2 in sigmoid ?Medicines:                Propofol per Anesthesia, Monitored Anesthesia Care ?Procedure:                Pre-Anesthesia Assessment: ?                          - Prior to the procedure, a History and Physical  ?                          was performed, and patient medications and  ?                          allergies were reviewed. The patient's tolerance of  ?                          previous anesthesia was also reviewed. The risks  ?                          and benefits of the procedure and the sedation  ?                          options and risks were discussed with the patient.  ?                          All questions were answered, and informed consent  ?                          was obtained. Prior Anticoagulants: The patient has  ?                          taken no previous anticoagulant or antiplatelet  ?  agents. ASA Grade Assessment: III - A patient with  ?                          severe systemic disease. After reviewing the risks  ?                          and benefits, the patient was deemed in  ?                          satisfactory condition to undergo the procedure. ?                          After obtaining informed consent, the colonoscope  ?                          was passed under  direct vision. Throughout the  ?                          procedure, the patient's blood pressure, pulse, and  ?                          oxygen saturations were monitored continuously. The  ?                          Colonoscope was introduced through the anus and  ?                          advanced to the the cecum, identified by  ?                          appendiceal orifice and ileocecal valve. The  ?                          colonoscopy was performed without difficulty. The  ?                          patient tolerated the procedure well. The quality  ?                          of the bowel preparation was good. The bowel  ?                          preparation used was SUPREP via split dose  ?                          instruction. The ileocecal valve, appendiceal  ?                          orifice, and rectum were photographed. ?Scope In: 8:53:12 AM ?Scope Out: 9:38:36 AM ?Scope Withdrawal Time: 0 hours 41 minutes 22 seconds  ?Total Procedure Duration: 0 hours 45 minutes 24 seconds  ?Findings:                 The perianal exam findings include a perianal rash. ?  The digital rectal exam was normal. ?                          Twenty (20) sessile polyps were found in the  ?                          rectum, sigmoid colon and descending colon. The  ?                          polyps were 2 to 20 mm in size. These polyps were  ?                          removed with a cold snare. Resection and retrieval  ?                          were complete. Verification of patient  ?                          identification for the specimen was done. Estimated  ?                          blood loss was minimal. ?                          Two sessile polyps were found in the transverse  ?                          colon and cecum. The polyps were 1 to 2 mm in size.  ?                          These polyps were removed with a cold biopsy  ?                          forceps. Resection and retrieval were  complete.  ?                          Verification of patient identification for the  ?                          specimen was done. Estimated blood loss was minimal. ?                          A tattoo was seen at the splenic flexure. ?                          The exam was otherwise without abnormality on  ?                          direct and retroflexion views. ?Complications:            No immediate complications. ?Estimated Blood Loss:     Estimated blood loss was minimal. ?Impression:               - Perianal rash found on perianal  exam. ?                          - Twenty (20) 2 to 20 mm polyps in the rectum, in  ?                          the sigmoid colon and in the descending colon,  ?                          removed with a cold snare. Resected and retrieved.  ?                          Mix of singl;e and piecemeal snaring ?                          - Two 1 to 2 mm polyps in the transverse colon and  ?                          in the cecum, removed with a cold biopsy forceps.  ?                          Resected and retrieved. ?                          - A tattoo was seen at the splenic flexure. ?                          - The examination was otherwise normal on direct  ?                          and retroflexion views. ?                          - Personal history of colonic polyps. Hx large  ?                          ceccal and other right sided- hyperplastic polyps  ?                          and at least one adenoma since 2005 ?                          08/2013 - 5 polyps 3 hyperplastic at splenic flexure  ?                          and 2 in sigmoid ?                          She must have a polyposis syndrome - suspect  ?                          serrated ?  There are multiple diminutive rectal polyps  ?                          remaining ?Recommendation:           - Patient has a contact number available for  ?                          emergencies. The signs and symptoms of  potential  ?                          delayed complications were discussed with the  ?                          patient. Return to normal activities tomorrow.  ?                          Written discharge instructions were provided to the  ?                          patient. ?                          - Resume previous diet. ?                          - Continue present medications. ?                          - Await pathology results. ?                          - Repeat colonoscopy is recommended. The  ?                          colonoscopy date will be determined after pathology  ?                          results from today's exam become available for  ?                          review. W/in 1 year and would schedule in 2 slots  ?                          (1 hour) ?                          Mycolog for dermatitios - Rx sent ?Gatha Mayer, MD ?06/20/2021 9:52:58 AM ?This report has been signed electronically. ?

## 2021-06-20 NOTE — Progress Notes (Signed)
Called to room to assist during endoscopic procedure.  Patient ID and intended procedure confirmed with present staff. Received instructions for my participation in the procedure from the performing physician.  

## 2021-06-22 ENCOUNTER — Telehealth: Payer: Self-pay | Admitting: *Deleted

## 2021-06-22 NOTE — Telephone Encounter (Signed)
?  Follow up Call- ? ?Call back number 06/20/2021  ?Post procedure Call Back phone  # [phone]  ?Permission to leave phone message Yes  ?Some recent data might be hidden  ?  ? ?Patient questions: ? ?Do you have a fever, pain , or abdominal swelling? No. ?Pain Score  0 * ? ?Have you tolerated food without any problems? Yes.   ? ?Have you been able to return to your normal activities? Yes.   ? ?Do you have any questions about your discharge instructions: ?Diet   No. ?Medications  No. ?Follow up visit  No. ? ?Do you have questions or concerns about your Care? No. ? ?Actions: ?* If pain score is 4 or above: ?No action needed, pain <4. ? ? ?

## 2021-06-26 ENCOUNTER — Encounter: Payer: Self-pay | Admitting: Internal Medicine

## 2022-05-02 ENCOUNTER — Other Ambulatory Visit: Payer: Self-pay | Admitting: Family Medicine

## 2022-05-02 DIAGNOSIS — Z1231 Encounter for screening mammogram for malignant neoplasm of breast: Secondary | ICD-10-CM

## 2022-06-21 ENCOUNTER — Ambulatory Visit
Admission: RE | Admit: 2022-06-21 | Discharge: 2022-06-21 | Disposition: A | Discharge status: Home / Self Care | Payer: Medicare HMO | Source: Ambulatory Visit | Attending: Family Medicine | Admitting: Family Medicine

## 2022-06-21 DIAGNOSIS — Z1231 Encounter for screening mammogram for malignant neoplasm of breast: Secondary | ICD-10-CM | POA: Diagnosis not present

## 2022-06-26 DIAGNOSIS — R7303 Prediabetes: Secondary | ICD-10-CM | POA: Diagnosis not present

## 2022-06-26 DIAGNOSIS — J441 Chronic obstructive pulmonary disease with (acute) exacerbation: Secondary | ICD-10-CM | POA: Diagnosis not present

## 2022-06-26 DIAGNOSIS — Z86010 Personal history of colonic polyps: Secondary | ICD-10-CM | POA: Diagnosis not present

## 2022-06-26 DIAGNOSIS — R32 Unspecified urinary incontinence: Secondary | ICD-10-CM | POA: Diagnosis not present

## 2022-06-26 DIAGNOSIS — Z0000 Encounter for general adult medical examination without abnormal findings: Secondary | ICD-10-CM | POA: Diagnosis not present

## 2022-06-26 DIAGNOSIS — J449 Chronic obstructive pulmonary disease, unspecified: Secondary | ICD-10-CM | POA: Diagnosis not present

## 2022-06-26 DIAGNOSIS — F339 Major depressive disorder, recurrent, unspecified: Secondary | ICD-10-CM | POA: Diagnosis not present

## 2022-06-26 DIAGNOSIS — E559 Vitamin D deficiency, unspecified: Secondary | ICD-10-CM | POA: Diagnosis not present

## 2022-06-26 DIAGNOSIS — E785 Hyperlipidemia, unspecified: Secondary | ICD-10-CM | POA: Diagnosis not present

## 2022-06-27 ENCOUNTER — Encounter: Payer: Self-pay | Admitting: Internal Medicine

## 2022-07-17 DIAGNOSIS — Z87891 Personal history of nicotine dependence: Secondary | ICD-10-CM | POA: Diagnosis not present

## 2022-07-18 ENCOUNTER — Institutional Professional Consult (permissible substitution): Payer: Medicare HMO | Admitting: Pulmonary Disease

## 2022-07-24 ENCOUNTER — Encounter: Payer: Self-pay | Admitting: Internal Medicine

## 2022-07-25 ENCOUNTER — Encounter: Payer: Medicare HMO | Admitting: Internal Medicine

## 2022-08-06 ENCOUNTER — Institutional Professional Consult (permissible substitution): Payer: Medicare HMO | Admitting: Pulmonary Disease

## 2023-01-23 ENCOUNTER — Encounter: Payer: Self-pay | Admitting: Internal Medicine

## 2023-01-23 ENCOUNTER — Ambulatory Visit (AMBULATORY_SURGERY_CENTER): Payer: Medicare HMO

## 2023-01-23 VITALS — Ht 64.0 in | Wt 175.0 lb

## 2023-01-23 DIAGNOSIS — Z860100 Personal history of colon polyps, unspecified: Secondary | ICD-10-CM

## 2023-01-23 MED ORDER — NA SULFATE-K SULFATE-MG SULF 17.5-3.13-1.6 GM/177ML PO SOLN: 1.0000 | Freq: Once | ORAL | 0 refills | Status: AC

## 2023-01-23 NOTE — Progress Notes (Signed)
Pre visit completed via phone call; Patient verified name, DOB, and address; No egg or soy allergy known to patient;  No issues known to pt with past sedation with any surgeries or procedures; Patient denies ever being told they had issues or difficulty with intubation; No FH of Malignant Hyperthermia; Pt is not on diet pills; Pt is not on home 02;  Pt is not on blood thinners;  Pt denies issues with constipation;  No A fib or A flutter; Have any cardiac testing pending--NO Insurance verified during PV appt--- [employer] Medicare Pt can ambulate without assistance;  Pt denies use of chewing tobacco; Discussed diabetic/weight loss medication holds; Discussed NSAID holds; Checked BMI to be less than 50; Pt instructed to use [employer] or GoodRx for a price reduction on prep;   Pre visit completed and red dot placed by patient's name on their procedure day (on provider's schedule);  Instructions sent to patient via MyChart per her request;

## 2023-01-27 ENCOUNTER — Encounter: Payer: Self-pay | Admitting: Certified Registered Nurse Anesthetist

## 2023-01-31 ENCOUNTER — Encounter: Payer: Self-pay | Admitting: Internal Medicine

## 2023-01-31 ENCOUNTER — Ambulatory Visit: Payer: Medicare HMO | Admitting: Internal Medicine

## 2023-01-31 VITALS — BP 127/64 | HR 68 | Temp 97.8°F | Resp 15 | Ht 64.0 in | Wt 175.0 lb

## 2023-01-31 DIAGNOSIS — K635 Polyp of colon: Secondary | ICD-10-CM | POA: Diagnosis not present

## 2023-01-31 DIAGNOSIS — D123 Benign neoplasm of transverse colon: Secondary | ICD-10-CM

## 2023-01-31 DIAGNOSIS — Z09 Encounter for follow-up examination after completed treatment for conditions other than malignant neoplasm: Secondary | ICD-10-CM | POA: Diagnosis not present

## 2023-01-31 DIAGNOSIS — D128 Benign neoplasm of rectum: Secondary | ICD-10-CM

## 2023-01-31 DIAGNOSIS — D122 Benign neoplasm of ascending colon: Secondary | ICD-10-CM

## 2023-01-31 DIAGNOSIS — D120 Benign neoplasm of cecum: Secondary | ICD-10-CM | POA: Diagnosis not present

## 2023-01-31 DIAGNOSIS — Z860100 Personal history of colon polyps, unspecified: Secondary | ICD-10-CM | POA: Diagnosis not present

## 2023-01-31 DIAGNOSIS — D125 Benign neoplasm of sigmoid colon: Secondary | ICD-10-CM | POA: Diagnosis not present

## 2023-01-31 DIAGNOSIS — J449 Chronic obstructive pulmonary disease, unspecified: Secondary | ICD-10-CM | POA: Diagnosis not present

## 2023-01-31 DIAGNOSIS — D126 Benign neoplasm of colon, unspecified: Secondary | ICD-10-CM

## 2023-01-31 MED ORDER — SODIUM CHLORIDE 0.9 % IV SOLN: 500.0000 mL | Freq: Once | INTRAVENOUS | Status: DC

## 2023-01-31 NOTE — Progress Notes (Signed)
Called to room to assist during endoscopic procedure.  Patient ID and intended procedure confirmed with present staff. Received instructions for my participation in the procedure from the performing physician.  

## 2023-01-31 NOTE — Op Note (Signed)
[facility] Patient Name: Jacqueline Boone Procedure Date: 01/31/2023 2:02 PM MRN: [id number] Endoscopist: Iva Boop , MD, [id number] Age: 72 Referring MD:  Date of Birth: 05-27-1950 Gender: Female Account #: [id number] Procedure:                Colonoscopy Indications:              High risk colon cancer surveillance: Personal                            history of colonic polyps- Hyperplastic polyposis,                            Last colonoscopy: March 2023                           Hx large ceccal and other right sided- hyperplastic                            polyps and at least one adenoma since 2005                           08/2013 - 5 polyps 3 hyperplastic at splenic flexure                            and 2 in sigmoid -                           06/20/2021 22 polyps removed max 2 cm - diminutive                            polyps remain 20 were hyperplastic Medicines:                Monitored Anesthesia Care Procedure:                Pre-Anesthesia Assessment:                           - Prior to the procedure, a History and Physical                            was performed, and patient medications and                            allergies were reviewed. The patient's tolerance of                            previous anesthesia was also reviewed. The risks                            and benefits of the procedure and the sedation                            options and risks were discussed with the patient.  All questions were answered, and informed consent                            was obtained. Prior Anticoagulants: The patient has                            taken no anticoagulant or antiplatelet agents. ASA                            Grade Assessment: II - A patient with mild systemic                            disease. After reviewing the risks and benefits,                            the patient was deemed in satisfactory condition to                             undergo the procedure.                           After obtaining informed consent, the colonoscope                            was passed under direct vision. Throughout the                            procedure, the patient's blood pressure, pulse, and                            oxygen saturations were monitored continuously. The                            Olympus Scope SN: [id number] was introduced through                            the anus and advanced to the the cecum, identified                            by appendiceal orifice and ileocecal valve. The                            colonoscopy was performed without difficulty. The                            patient tolerated the procedure well. The quality                            of the bowel preparation was adequate. The                            ileocecal valve, appendiceal orifice, and rectum  were photographed. The bowel preparation used was                            SUPREP via split dose instruction. Scope In: 2:11:56 PM Scope Out: 2:44:44 PM Scope Withdrawal Time: 0 hours 28 minutes 55 seconds  Total Procedure Duration: 0 hours 32 minutes 48 seconds  Findings:                 The perianal exam findings include a perianal rash.                           Fifteen (15) sessile polyps were found in the                            rectum, sigmoid colon, transverse colon, ascending                            colon and cecum. The polyps were 2 to 8 mm in size.                            These polyps were removed with a cold snare.                            Resection was complete, but the polyp tissue was                            only partially retrieved. Verification of patient                            identification for the specimen was done. Estimated                            blood loss was minimal.                           Internal hemorrhoids were found.                            Many flat polyps were found in the rectum and                            sigmoid colon. The polyps were diminutive in size.                           The exam was otherwise without abnormality on                            direct and retroflexion views. Complications:            No immediate complications. Estimated Blood Loss:     Estimated blood loss was minimal. Impression:               - Perianal rash found on perianal exam.                           -  Fifteen (15) 2 to 8 mm polyps in the rectum, in                            the sigmoid colon, in the transverse colon, in the                            ascending colon and in the cecum, removed with a                            cold snare. Complete resection. Partial retrieval.                            12 were recovered.                           - Internal hemorrhoids.                           - Many diminutive polyps in the rectum and in the                            sigmoid colon.                           - The examination was otherwise normal on direct                            and retroflexion views except for tatoo at splenic                            flexure Recommendation:           - Patient has a contact number available for                            emergencies. The signs and symptoms of potential                            delayed complications were discussed with the                            patient. Return to normal activities tomorrow.                            Written discharge instructions were provided to the                            patient.                           - Resume previous diet.                           - Continue present medications.                           -  Await pathology results.                           - Repeat colonoscopy in 1-2 years year for                            surveillance. Decide after pathology review Iva Boop, MD 01/31/2023 2:59:35 PM This report has  been signed electronically.

## 2023-01-31 NOTE — Progress Notes (Signed)
[facility] History and Physical   Primary Care Physician:  Macy Mis, MD   Reason for Procedure:    Encounter Diagnosis  Name Primary?   Hyperplastic polyposis syndrome Yes    Plan:    colonoscopy     HPI: Jacqueline Boone is a 72 y.o. female here due to hyperplastic polyposis syndrome as below.  Hx large ceccal and other right sided- hyperplastic polyps and at least one adenoma since 2005 08/2013 - 5 polyps  3 hyperplastic at splenic flexure and 2 in sigmoid - 06/20/2021 22 polyps removed max 2 cm - diminutive polyps remain 20 were hyperplastic  Past Medical History:  Diagnosis Date   Allergic rhinitis    Allergy    Benign neoplasm of colon 2006   Adenomas, cigarette adenomas   Colon polyps    Condyloma 1995   COPD (chronic obstructive pulmonary disease) (HCC)    Dermatitis    Gastritis, chronic    GERD (gastroesophageal reflux disease)    H pylori ulcer    H. pylori infection    Helicobacter pylori gastritis    eradicated   Hyperplastic polyposis syndrome and hx adenomas 12/26/2009   Hx large ceccal and other right sided- hyperplastic polyps and at least one adenoma since 2005 08/2013 - 5 polyps  3 hyperplastic at splenic flexure and 2 in sigmoid - 06/20/2021 22 polyps removed max 2 cm - diminutive polyps remain 20 were hyperplastic      IBS (irritable bowel syndrome)    Ledderhose's disease    PUD (peptic ulcer disease)    Wrist fracture    left    Past Surgical History:  Procedure Laterality Date   CERVICAL DISCECTOMY  1996   C4,C5   COLONOSCOPY  Multiple   last 08/18/13 Steffanie Mingle-polyps   ESOPHAGOGASTRODUODENOSCOPY  Multiple   POLYPECTOMY     TOTAL ABDOMINAL HYSTERECTOMY W/ BILATERAL SALPINGOOPHORECTOMY  1974   with BSO    Prior to Admission medications   Medication Sig Start Date End Date Taking? Authorizing Provider  aspirin EC 81 MG tablet Take 81 mg by mouth daily.   Yes [provider]  fluticasone (FLONASE) 50 MCG/ACT  nasal spray Place 1 spray into both nostrils daily. Patient taking differently: Place 1 spray into both nostrils daily as needed for allergies. 08/27/19  Yes Charlott Holler, MD  fluticasone furoate-vilanterol (BREO ELLIPTA) 100-25 MCG/ACT AEPB Inhale 1 puff into the lungs daily as needed.   Yes [provider]  ibuprofen (ADVIL) 200 MG tablet Take 200 mg by mouth every 6 (six) hours as needed. 2 as needed for pain   Yes [provider]  Multiple Vitamin (MULTIVITAMIN) tablet Take 1 tablet by mouth daily.   Yes [provider]  nystatin-triamcinolone ointment (MYCOLOG) Apply 1 application. topically 2 (two) times daily. 06/20/21  Yes Iva Boop, MD  triamcinolone cream (KENALOG) 0.1 % Apply 1 application topically as needed.   Yes [provider]  omeprazole (PRILOSEC) 40 MG capsule TAKE 1 CAPSULE(40 MG) BY MOUTH DAILY Patient not taking: Reported on 01/31/2023 04/21/20   Charlott Holler, MD    Current Outpatient Medications  Medication Sig Dispense Refill   aspirin EC 81 MG tablet Take 81 mg by mouth daily.     fluticasone (FLONASE) 50 MCG/ACT nasal spray Place 1 spray into both nostrils daily. (Patient taking differently: Place 1 spray into both nostrils daily as needed for allergies.) 16 g 5   fluticasone furoate-vilanterol (BREO ELLIPTA) 100-25 MCG/ACT  AEPB Inhale 1 puff into the lungs daily as needed.     ibuprofen (ADVIL) 200 MG tablet Take 200 mg by mouth every 6 (six) hours as needed. 2 as needed for pain     Multiple Vitamin (MULTIVITAMIN) tablet Take 1 tablet by mouth daily.     nystatin-triamcinolone ointment (MYCOLOG) Apply 1 application. topically 2 (two) times daily. 30 g 0   triamcinolone cream (KENALOG) 0.1 % Apply 1 application topically as needed.     omeprazole (PRILOSEC) 40 MG capsule TAKE 1 CAPSULE(40 MG) BY MOUTH DAILY (Patient not taking: Reported on 01/31/2023) 30 capsule 4   Current Facility-Administered Medications  Medication  Dose Route Frequency Provider Last Rate Last Admin   0.9 %  sodium chloride infusion  500 mL Intravenous Once Iva Boop, MD        Allergies as of 01/31/2023   (No Known Allergies)    Family History  Problem Relation Age of Onset   Hodgkin's lymphoma Father    Emphysema Father    Glaucoma Brother        blind   Mental retardation Brother    Emphysema Mother    Colon cancer Neg Hx    Pancreatic cancer Neg Hx    Rectal cancer Neg Hx    Stomach cancer Neg Hx    Colon polyps Neg Hx    Esophageal cancer Neg Hx     Social History   Socioeconomic History   Marital status: Married    Spouse name: Not on file   Number of children: 0   Years of education: Not on file   Highest education level: Not on file  Occupational History    Employer: UNEMPLOYED  Tobacco Use   Smoking status: Former    Current packs/day: 0.00    Average packs/day: 1 pack/day for 50.0 years (50.0 ttl pk-yrs)    Types: Cigarettes    Start date: 12/10/1958    Quit date: 12/09/2008    Years since quitting: 14.1    Passive exposure: Never   Smokeless tobacco: Never  Vaping Use   Vaping status: Never Used  Substance and Sexual Activity   Alcohol use: Yes    Alcohol/week: 4.0 standard drinks of alcohol    Types: 4 Standard drinks or equivalent per week    Comment: social   Drug use: No   Sexual activity: Yes    Birth control/protection: Surgical    Comment: hysterectomy  Other Topics Concern   Not on file  Social History Narrative   Married   no kids   Patient is a former smoker.     Alcohol Use - yes   Daily Caffeine Use   Works part-time at [employer]   Financial Resource Strain: Patient Declined (06/23/2022)   Received from [facility], [facility]   Overall Financial Resource Strain (CARDIA)    Difficulty of Paying Living Expenses: Patient declined  Food Insecurity: Patient Declined (06/23/2022)   Received from [facility], [facility]   Hunger Vital Sign    Worried About Running Out of Food in the Last Year: Patient declined    Ran Out of Food in the Last Year: Patient declined  Transportation Needs: Patient Declined (06/23/2022)   Received from [employer], [facility]   PRAPARE - Transportation    Lack of Transportation (Medical): Patient declined    Lack of Transportation (Non-Medical): Patient declined  Physical Activity: Unknown (06/23/2022)   Received from [facility], [facility]   Exercise Vital Sign    Days of Exercise per Week: Patient declined    Minutes of Exercise per Session: Not on file  Stress: Patient Declined (06/23/2022)   Received from [facility], [facility] - Occupational Stress Questionnaire    Feeling of Stress : Patient declined  Social Connections: Socially Integrated (06/23/2022)   Received from [facility], [facility]   Social Network    How would you rate your social network (family, work, friends)?: Good participation with social networks  Intimate Partner Violence: Not At Risk (06/23/2022)   Received from [facility], [facility]   HITS    Over the last 12 months how often did your partner physically hurt you?: 1    Over the last 12 months how often did your partner insult you or talk down to you?: 1    Over the last 12 months how often did your partner threaten you with physical harm?: 1    Over the last 12 months how often did your partner scream or curse at you?: 1    Review of Systems:  All other review of systems negative except as mentioned in the HPI.  Physical Exam: Vital signs BP 124/68   Pulse 71   Temp 97.8 F (36.6 C)   Ht 5\' 4"  (1.626 m)   Wt 175 lb (79.4 kg)   LMP 04/09/1972 (Approximate)   SpO2 94%   BMI 30.04 kg/m   General:   Alert,  Well-developed, well-nourished, pleasant and cooperative in NAD Lungs:  Clear throughout to auscultation.   Heart:  Regular rate and rhythm; no murmurs,  clicks, rubs,  or gallops. Abdomen:  Soft, nontender and nondistended. Normal bowel sounds.   Neuro/Psych:  Alert and cooperative. Normal mood and affect. A and O x 3   @Kyden Potash  Sena Slate, MD, [facility] [phone] (pager) 01/31/2023 2:03 PM@

## 2023-01-31 NOTE — Progress Notes (Signed)
Report given to PACU, vss 

## 2023-01-31 NOTE — Patient Instructions (Addendum)
I found and removed 15 small polyps today. I will let you know pathology results and when to have another routine colonoscopy by mail and/or My Chart.  You do have hemorrhoids and a perianal rash - and one or both may cause the bleeding you see. Try som Desitin on the rash (might have been from the prep an wiping).  I appreciate the opportunity to care for you. Iva Boop, MD, FACG   YOU HAD AN ENDOSCOPIC PROCEDURE TODAY AT THE [facility]:   Refer to the procedure report that was given to you for any specific questions about what was found during the examination.  If the procedure report does not answer your questions, please call your gastroenterologist to clarify.  If you requested that your care partner not be given the details of your procedure findings, then the procedure report has been included in a sealed envelope for you to review at your convenience later.  YOU SHOULD EXPECT: Some feelings of bloating in the abdomen. Passage of more gas than usual.  Walking can help get rid of the air that was put into your GI tract during the procedure and reduce the bloating. If you had a lower endoscopy (such as a colonoscopy or flexible sigmoidoscopy) you may notice spotting of blood in your stool or on the toilet paper. If you underwent a bowel prep for your procedure, you may not have a normal bowel movement for a few days.  Please Note:  You might notice some irritation and congestion in your nose or some drainage.  This is from the oxygen used during your procedure.  There is no need for concern and it should clear up in a day or so.  SYMPTOMS TO REPORT IMMEDIATELY:  Following lower endoscopy (colonoscopy or flexible sigmoidoscopy):  Excessive amounts of blood in the stool  Significant tenderness or worsening of abdominal pains  Swelling of the abdomen that is new, acute  Fever of 100F or higher   For urgent or emergent issues, a gastroenterologist can be reached at any  hour by calling [phone]. Do not use MyChart messaging for urgent concerns.    DIET:  We do recommend a small meal at first, but then you may proceed to your regular diet.  Drink plenty of fluids but you should avoid alcoholic beverages for 24 hours.  ACTIVITY:  You should plan to take it easy for the rest of today and you should NOT DRIVE or use heavy machinery until tomorrow (because of the sedation medicines used during the test).    FOLLOW UP: Our staff will call the number listed on your records the next business day following your procedure.  We will call around 7:15- 8:00 am to check on you and address any questions or concerns that you may have regarding the information given to you following your procedure. If we do not reach you, we will leave a message.     If any biopsies were taken you will be contacted by phone or by letter within the next 1-3 weeks.  Please call us at [phone] if you have not heard about the biopsies in 3 weeks.    SIGNATURES/CONFIDENTIALITY: You and/or your care partner have signed paperwork which will be entered into your electronic medical record.  These signatures attest to the fact that that the information above on your After Visit Summary has been reviewed and is understood.  Full responsibility of the confidentiality of this discharge information lies with you  and/or your care-partner.

## 2023-02-01 ENCOUNTER — Telehealth: Payer: Self-pay

## 2023-02-01 NOTE — Telephone Encounter (Signed)
  Follow up Call-     01/31/2023    1:12 PM 06/20/2021    7:56 AM  Call back number  Post procedure Call Back phone  # [phone]  Permission to leave phone message Yes Yes     Patient questions:  Do you have a fever, pain , or abdominal swelling? No. Pain Score  0 *  Have you tolerated food without any problems? Yes.    Have you been able to return to your normal activities? Yes.    Do you have any questions about your discharge instructions: Diet   No. Medications  No. Follow up visit  No.  Do you have questions or concerns about your Care? No.  Actions: * If pain score is 4 or above: No action needed, pain <4.

## 2023-02-07 LAB — SURGICAL PATHOLOGY

## 2023-02-08 ENCOUNTER — Encounter: Payer: Self-pay | Admitting: Internal Medicine

## 2023-06-04 ENCOUNTER — Other Ambulatory Visit: Payer: Self-pay | Admitting: Family Medicine

## 2023-06-04 DIAGNOSIS — Z0000 Encounter for general adult medical examination without abnormal findings: Secondary | ICD-10-CM

## 2023-06-24 ENCOUNTER — Inpatient Hospital Stay: Admission: RE | Admit: 2023-06-24 | Payer: Medicare HMO | Source: Ambulatory Visit

## 2023-06-24 ENCOUNTER — Ambulatory Visit
Admission: RE | Admit: 2023-06-24 | Discharge: 2023-06-24 | Disposition: A | Discharge status: Home / Self Care | Source: Ambulatory Visit | Attending: Family Medicine | Admitting: Family Medicine

## 2023-06-24 DIAGNOSIS — Z1231 Encounter for screening mammogram for malignant neoplasm of breast: Secondary | ICD-10-CM | POA: Diagnosis not present

## 2023-06-24 DIAGNOSIS — Z0000 Encounter for general adult medical examination without abnormal findings: Secondary | ICD-10-CM

## 2023-06-25 ENCOUNTER — Encounter

## 2023-08-28 DIAGNOSIS — H10413 Chronic giant papillary conjunctivitis, bilateral: Secondary | ICD-10-CM | POA: Diagnosis not present

## 2023-08-28 DIAGNOSIS — H524 Presbyopia: Secondary | ICD-10-CM | POA: Diagnosis not present

## 2023-08-28 DIAGNOSIS — H2513 Age-related nuclear cataract, bilateral: Secondary | ICD-10-CM | POA: Diagnosis not present

## 2023-08-29 DIAGNOSIS — M25522 Pain in left elbow: Secondary | ICD-10-CM | POA: Diagnosis not present

## 2023-08-29 DIAGNOSIS — M25521 Pain in right elbow: Secondary | ICD-10-CM | POA: Diagnosis not present

## 2023-08-29 DIAGNOSIS — M79642 Pain in left hand: Secondary | ICD-10-CM | POA: Diagnosis not present

## 2023-08-29 DIAGNOSIS — M25421 Effusion, right elbow: Secondary | ICD-10-CM | POA: Diagnosis not present

## 2023-08-29 DIAGNOSIS — Z1330 Encounter for screening examination for mental health and behavioral disorders, unspecified: Secondary | ICD-10-CM | POA: Diagnosis not present

## 2023-08-29 DIAGNOSIS — M25422 Effusion, left elbow: Secondary | ICD-10-CM | POA: Diagnosis not present

## 2023-09-01 DIAGNOSIS — T148XXA Other injury of unspecified body region, initial encounter: Secondary | ICD-10-CM | POA: Diagnosis not present

## 2023-09-01 DIAGNOSIS — R599 Enlarged lymph nodes, unspecified: Secondary | ICD-10-CM | POA: Diagnosis not present

## 2023-09-01 DIAGNOSIS — R062 Wheezing: Secondary | ICD-10-CM | POA: Diagnosis not present

## 2023-09-01 DIAGNOSIS — R21 Rash and other nonspecific skin eruption: Secondary | ICD-10-CM | POA: Diagnosis not present

## 2023-09-01 DIAGNOSIS — L03119 Cellulitis of unspecified part of limb: Secondary | ICD-10-CM | POA: Diagnosis not present

## 2023-09-01 DIAGNOSIS — R55 Syncope and collapse: Secondary | ICD-10-CM | POA: Diagnosis not present

## 2023-09-06 DIAGNOSIS — E559 Vitamin D deficiency, unspecified: Secondary | ICD-10-CM | POA: Diagnosis not present

## 2023-09-06 DIAGNOSIS — R918 Other nonspecific abnormal finding of lung field: Secondary | ICD-10-CM | POA: Diagnosis not present

## 2023-09-06 DIAGNOSIS — Z780 Asymptomatic menopausal state: Secondary | ICD-10-CM | POA: Diagnosis not present

## 2023-09-06 DIAGNOSIS — R32 Unspecified urinary incontinence: Secondary | ICD-10-CM | POA: Diagnosis not present

## 2023-09-06 DIAGNOSIS — R7303 Prediabetes: Secondary | ICD-10-CM | POA: Diagnosis not present

## 2023-09-06 DIAGNOSIS — J449 Chronic obstructive pulmonary disease, unspecified: Secondary | ICD-10-CM | POA: Diagnosis not present

## 2023-09-06 DIAGNOSIS — Z0000 Encounter for general adult medical examination without abnormal findings: Secondary | ICD-10-CM | POA: Diagnosis not present

## 2023-09-06 DIAGNOSIS — K219 Gastro-esophageal reflux disease without esophagitis: Secondary | ICD-10-CM | POA: Diagnosis not present

## 2023-09-06 DIAGNOSIS — E785 Hyperlipidemia, unspecified: Secondary | ICD-10-CM | POA: Diagnosis not present

## 2023-09-06 DIAGNOSIS — F339 Major depressive disorder, recurrent, unspecified: Secondary | ICD-10-CM | POA: Diagnosis not present

## 2023-09-17 DIAGNOSIS — R32 Unspecified urinary incontinence: Secondary | ICD-10-CM | POA: Diagnosis not present

## 2023-09-17 DIAGNOSIS — F339 Major depressive disorder, recurrent, unspecified: Secondary | ICD-10-CM | POA: Diagnosis not present

## 2023-09-17 DIAGNOSIS — R768 Other specified abnormal immunological findings in serum: Secondary | ICD-10-CM | POA: Diagnosis not present

## 2023-09-17 DIAGNOSIS — E785 Hyperlipidemia, unspecified: Secondary | ICD-10-CM | POA: Diagnosis not present

## 2023-09-23 DIAGNOSIS — Z87891 Personal history of nicotine dependence: Secondary | ICD-10-CM | POA: Diagnosis not present

## 2023-09-25 DIAGNOSIS — H10413 Chronic giant papillary conjunctivitis, bilateral: Secondary | ICD-10-CM | POA: Diagnosis not present

## 2023-11-14 DIAGNOSIS — Z780 Asymptomatic menopausal state: Secondary | ICD-10-CM | POA: Diagnosis not present

## 2023-11-21 DIAGNOSIS — F339 Major depressive disorder, recurrent, unspecified: Secondary | ICD-10-CM | POA: Diagnosis not present

## 2023-11-21 DIAGNOSIS — L309 Dermatitis, unspecified: Secondary | ICD-10-CM | POA: Diagnosis not present

## 2023-11-21 DIAGNOSIS — E785 Hyperlipidemia, unspecified: Secondary | ICD-10-CM | POA: Diagnosis not present

## 2023-11-21 DIAGNOSIS — H04129 Dry eye syndrome of unspecified lacrimal gland: Secondary | ICD-10-CM | POA: Diagnosis not present

## 2023-11-21 DIAGNOSIS — R32 Unspecified urinary incontinence: Secondary | ICD-10-CM | POA: Diagnosis not present

## 2023-11-21 DIAGNOSIS — Z860100 Personal history of colon polyps, unspecified: Secondary | ICD-10-CM | POA: Diagnosis not present

## 2023-11-21 NOTE — Progress Notes (Signed)
 Subjective   HPI:  Jacqueline Boone is a 73 y.o.  female who presents to the office with:  Chief Complaint  Patient presents with  . Follow-up    Concerns of conjunctivitis after receiving treatment and ongoing skin concerns.   . Gaps In Care    Tdap vaccine// advised to receive at pharmacy.  See a/p  ROS: Negative for constitutional, CV, Resp, Abd/GU symptoms except as noted above in HPI.  PMH: Past medical history, Past surgical history, Social history, family history were reviewed as noted in EMR.  Pertinent for:  Past Medical History:  Diagnosis Date  . Dermatitis    arms and legs  . Gastritis   . H. pylori infection 2008  . Plantar fibromatosis    Podiatry     Medications and allergies reviewed.   Objective    Physical Exam:  Vital Signs: BP 119/73 (BP Location: Right Upper Arm, Patient Position: Sitting)   Pulse 84   Temp 97.1 F (36.2 C) (Temporal)   Resp 14   Ht 5' 3.62 (1.616 m)   Wt 177 lb 3.2 oz (80.4 kg)   SpO2 95%   BMI 30.78 kg/m   General:  Well appearing, Alert & Oriented CV:  Normal to palpation without thrill.  Regular Rate and Rhythm, No murmurs, rubs, or gallops.   Respiratory:  Normal respiratory effort.  No wheezes or crackles.  Good air movement without diminished sounds.   Abdomen:  Normal bowel sounds.  Non-tender to palpation.  No rebound or guarding.  No palpable liver or spleen. Extremities:  No pretibial edema  Assessment/Plan    No orders of the defined types were placed in this encounter.   Hyperlipidemia Feels has digestive upset when taking atorvastatin.  Rec changing to every other evening and titrate to every evening as tolerated.  Will let me know if not able to continue taking   History of colonic polyps Is aware she is due for colonoscopy this fall and will schedule   Urinary incontinence Changing to 10 mg with marked improvement, will refill   Dermatitis She is noting some skin color changes around elbows and  other palces- rec to follow-up with derm   Episode of recurrent major depressive disorder Some  improvement with increase in lexapro- declines need for further optimization at this time   Dry eye  She reports she recently saw and was told has allergic conjunctivitis and dry eye.  I rec she start daily lubricant eye drop and follow-up with ophtho if not improving   Follow up for AWV May.  No future appointments.  Medications at end of visit today: Current Medications[1]        [1]  Current Outpatient Medications:  .  albuterol sulfate HFA (PROVENTIL,VENTOLIN,PROAIR) 108 (90 Base) MCG/ACT inhaler, Inhale two puffs into the lungs every 6 (six) hours as needed for Wheezing., Disp: , Rfl:  .  aspirin (ECOTRIN LOW DOSE) EC tablet, Take one tablet (81 mg dose) by mouth daily., Disp: , Rfl:  .  atorvastatin (LIPITOR) 20 mg tablet, Take one tablet (20 mg dose) by mouth daily., Disp: 90 tablet, Rfl: 1 .  clindamycin (CLEOCIN) 300 mg capsule, Take one capsule (300 mg dose) by mouth 3 (three) times a day., Disp: , Rfl:  .  escitalopram oxalate (LEXAPRO) 20 mg tablet, Take one tablet (20 mg dose) by mouth daily., Disp: 90 tablet, Rfl: 2 .  fluticasone  propionate (FLONASE ) 50 mcg/actuation nasal spray, two sprays by Nasal route daily.,  Disp: 16 g, Rfl: 11 .  fluticasone -salmeterol (ADVAIR DISKUS/WIXELA) 250-50 mcg/dose AEPB inhalation powder, Inhale one puff into the lungs 2 (two) times daily., Disp: 60 each, Rfl: 11 .  Multiple Vitamin (MULTIVITAMIN) tablet, Take one tablet by mouth daily., Disp: , Rfl:  .  omeprazole  (PRILOSEC) 20 mg capsule, Take one capsule to two capsules (20-40 mg dose) by mouth daily., Disp: 180 capsule, Rfl: 3 .  solifenacin succinate (VESICARE) 10 mg tablet, Take one tablet (10 mg dose) by mouth daily., Disp: 90 tablet, Rfl: 2 .  triamcinolone  acetonide (KENALOG) 0.1% cream, Apply one g (1 Application dose) topically 2 (two) times a day as needed., Disp: 30 g, Rfl: 0

## 2024-02-04 IMAGING — MG MM DIGITAL SCREENING BILAT W/ TOMO AND CAD
8 series · 8 of 24 positions shown · non-contrast
Comparison: Previous exam(s).

CLINICAL DATA: Screening.

EXAM:
DIGITAL SCREENING BILATERAL MAMMOGRAM WITH TOMOSYNTHESIS AND CAD
TECHNIQUE: Bilateral screening digital craniocaudal and mediolateral oblique
mammograms were obtained. Bilateral screening digital breast
tomosynthesis was performed. The images were evaluated with
computer-aided detection.

[R CC synth-2D]
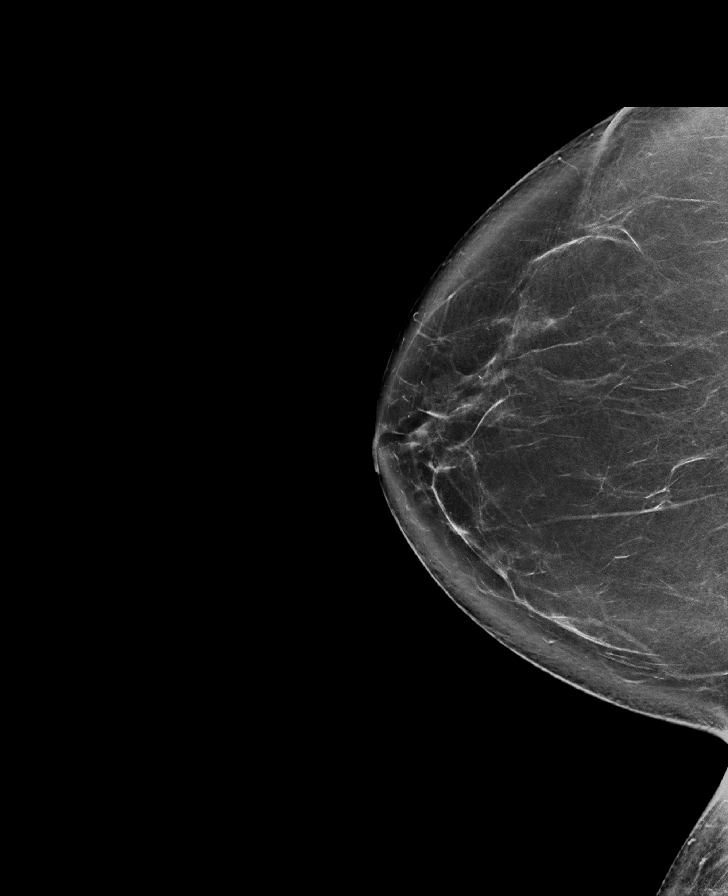

[L MLO synth-2D]
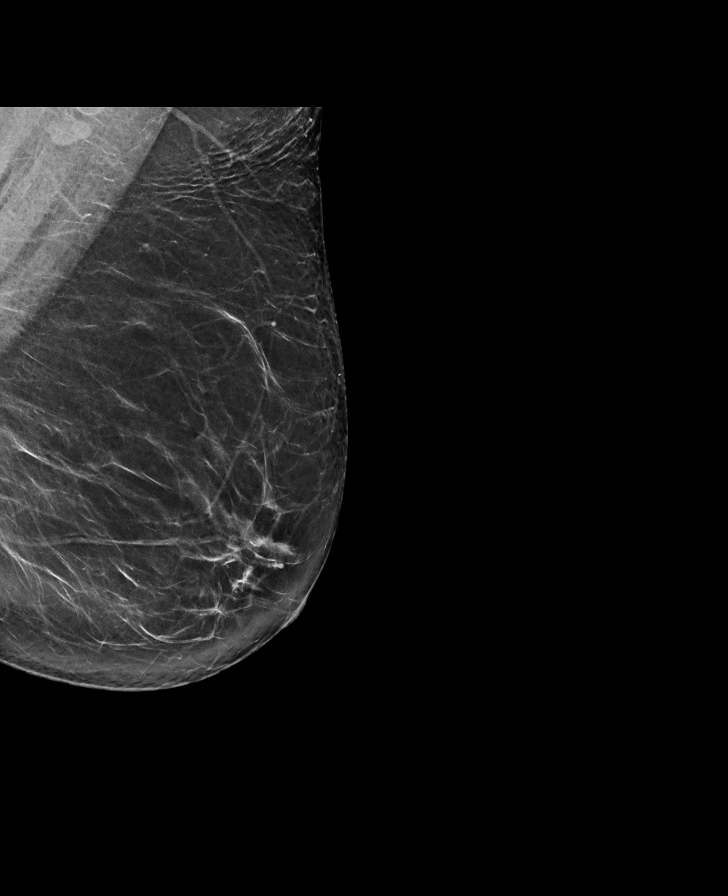

[R MLO synth-2D]
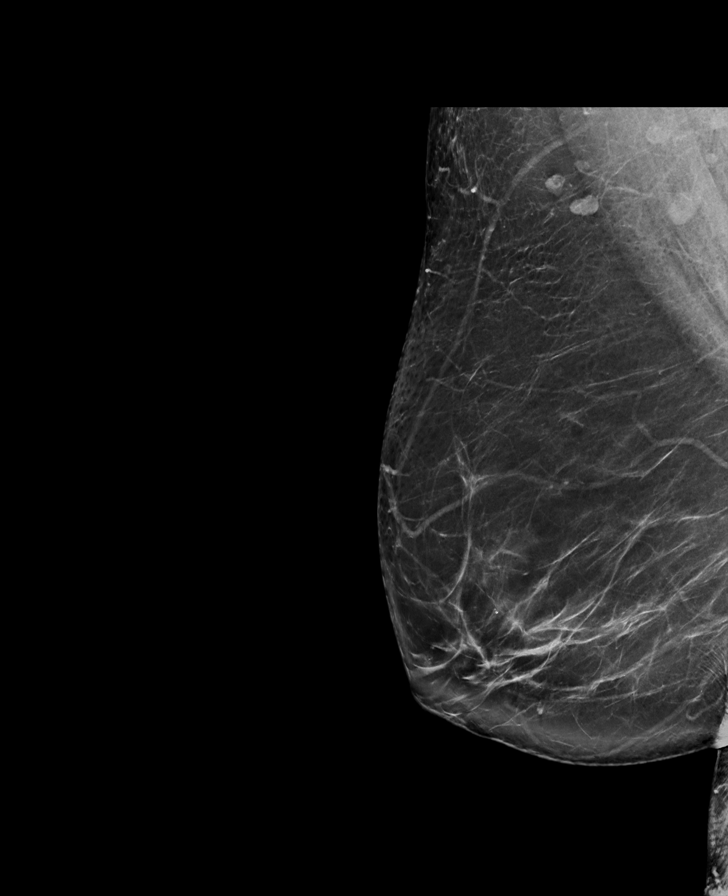

[L CC synth-2D]
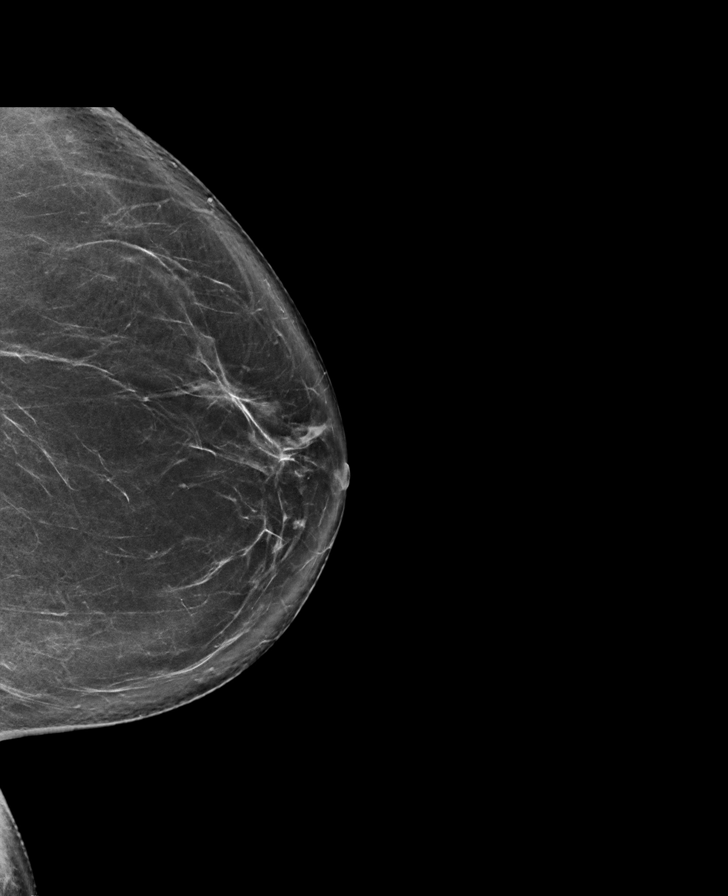

[L CC tomo · tomo slice 44/87.0]
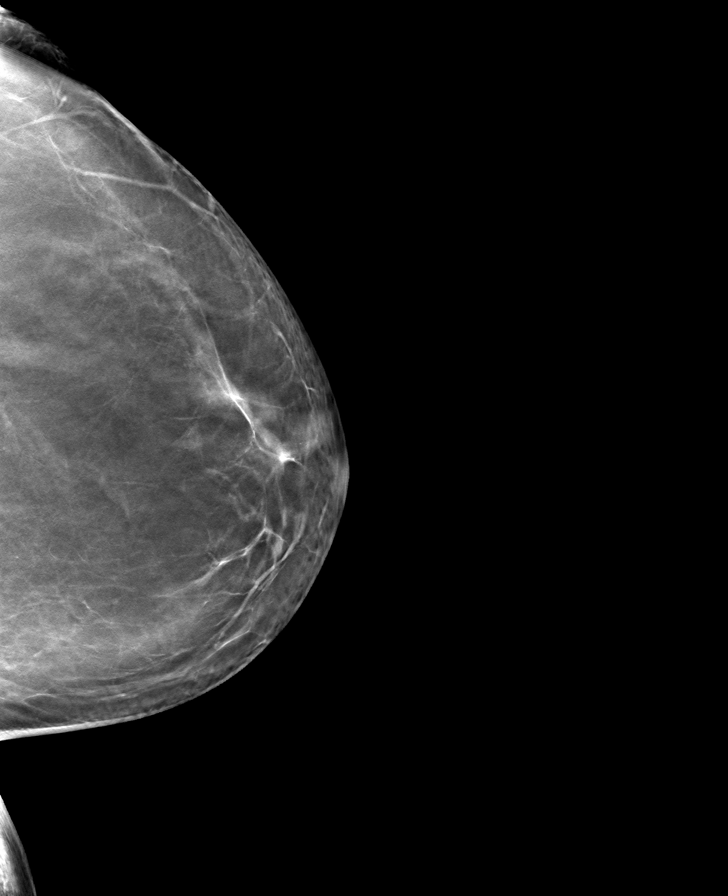

[R MLO tomo · tomo slice 47/93.0]
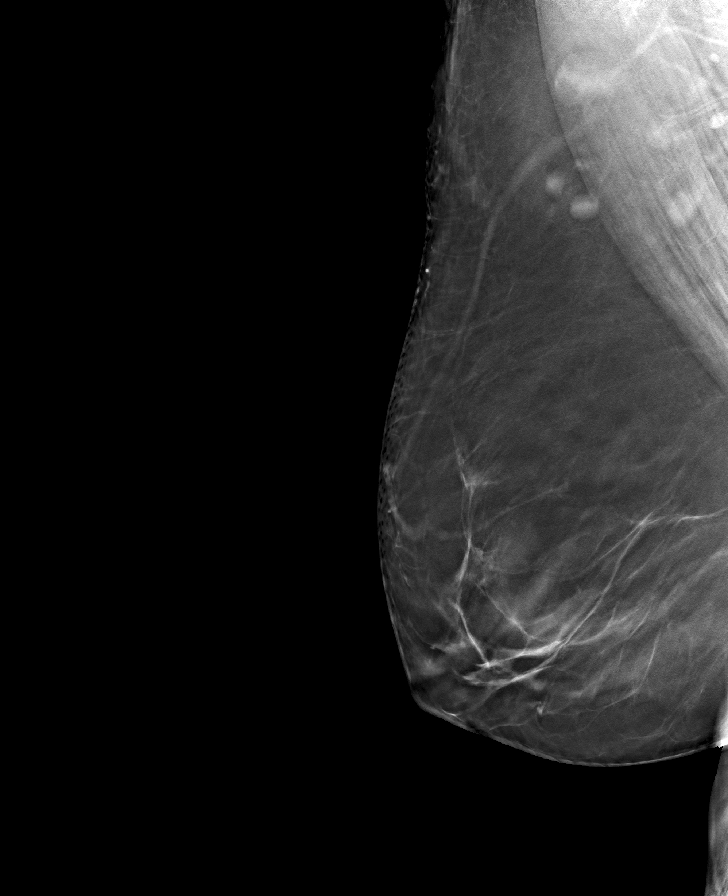

[R CC tomo · tomo slice 46/91.0]
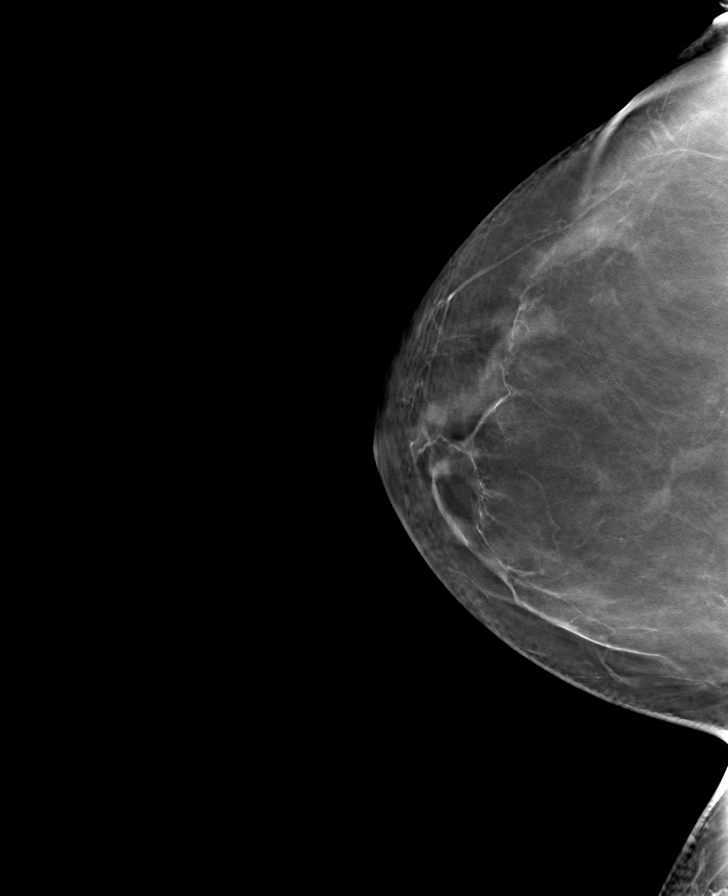

[L MLO tomo · tomo slice 44/87.0]
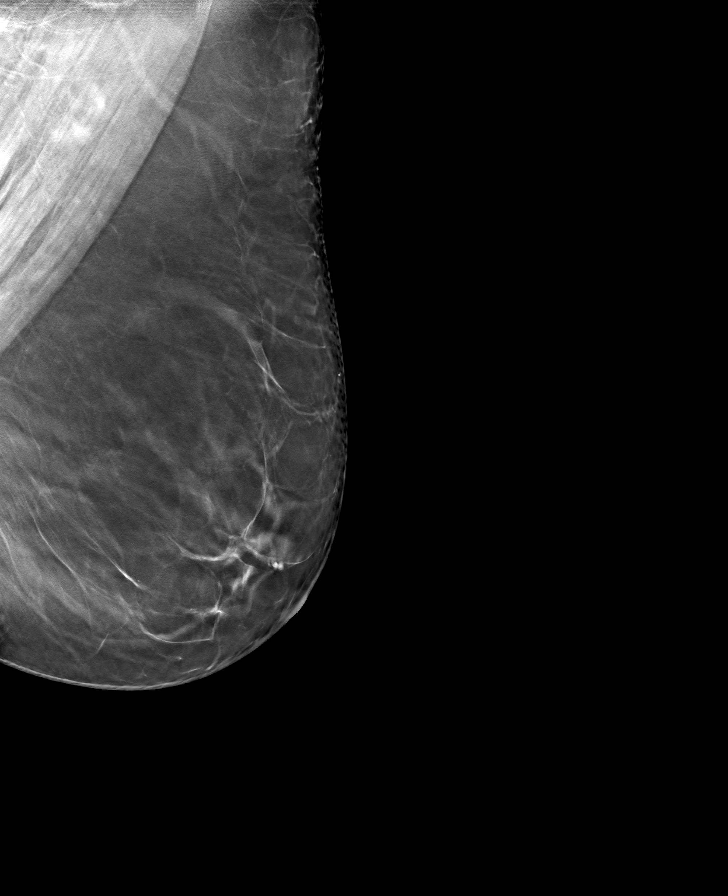

[8 of 24 positions shown; findings below may reference images not displayed]

ACR Breast Density Category b: There are scattered areas of
fibroglandular density.
FINDINGS: There are no findings suspicious for malignancy.
IMPRESSION: No mammographic evidence of malignancy. A result letter of this
screening mammogram will be mailed directly to the patient.

RECOMMENDATION:
Screening mammogram in one year. (Code:51-O-LD2)

BI-RADS CATEGORY  1: Negative.
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
## 2024-03-17 ENCOUNTER — Ambulatory Visit: Attending: Rheumatology | Admitting: Rheumatology

## 2024-03-17 ENCOUNTER — Encounter: Payer: Self-pay | Admitting: Rheumatology

## 2024-03-17 VITALS — BP 131/85 | HR 80 | Temp 97.0°F | Resp 16 | Ht 63.0 in | Wt 172.8 lb

## 2024-03-17 DIAGNOSIS — Z860100 Personal history of colon polyps, unspecified: Secondary | ICD-10-CM

## 2024-03-17 DIAGNOSIS — K219 Gastro-esophageal reflux disease without esophagitis: Secondary | ICD-10-CM

## 2024-03-17 DIAGNOSIS — E782 Mixed hyperlipidemia: Secondary | ICD-10-CM

## 2024-03-17 DIAGNOSIS — Z981 Arthrodesis status: Secondary | ICD-10-CM

## 2024-03-17 DIAGNOSIS — L309 Dermatitis, unspecified: Secondary | ICD-10-CM

## 2024-03-17 DIAGNOSIS — R7689 Other specified abnormal immunological findings in serum: Secondary | ICD-10-CM

## 2024-03-17 DIAGNOSIS — L570 Actinic keratosis: Secondary | ICD-10-CM

## 2024-03-17 DIAGNOSIS — K582 Mixed irritable bowel syndrome: Secondary | ICD-10-CM

## 2024-03-17 DIAGNOSIS — M722 Plantar fascial fibromatosis: Secondary | ICD-10-CM

## 2024-03-17 DIAGNOSIS — Z87891 Personal history of nicotine dependence: Secondary | ICD-10-CM

## 2024-03-17 DIAGNOSIS — F330 Major depressive disorder, recurrent, mild: Secondary | ICD-10-CM

## 2024-03-17 DIAGNOSIS — M19041 Primary osteoarthritis, right hand: Secondary | ICD-10-CM

## 2024-03-17 DIAGNOSIS — J449 Chronic obstructive pulmonary disease, unspecified: Secondary | ICD-10-CM

## 2024-03-17 DIAGNOSIS — J3089 Other allergic rhinitis: Secondary | ICD-10-CM

## 2024-03-19 ENCOUNTER — Encounter: Payer: Self-pay | Admitting: Internal Medicine

## 2024-03-20 ENCOUNTER — Ambulatory Visit: Payer: Self-pay | Admitting: Rheumatology

## 2024-03-20 LAB — PROTEIN / CREATININE RATIO, URINE
Creatinine, Urine: 78 mg/dL (ref 20–275)
Protein/Creat Ratio: 51 mg/g{creat} (ref 24–184)
Protein/Creatinine Ratio: 0.051 mg/mg{creat} (ref 0.024–0.184)
Total Protein, Urine: 4 mg/dL — ABNORMAL LOW (ref 5–24)

## 2024-03-20 LAB — CARDIOLIPIN ANTIBODIES, IGG, IGM, IGA
Anticardiolipin IgA: <2 [APL'U]/mL (ref <20.0)
Anticardiolipin IgG: <2 [GPL'U]/mL (ref <20.0)
Anticardiolipin IgM: 3.5 [MPL'U]/mL (ref <20.0)

## 2024-03-20 LAB — RNP ANTIBODY: Ribonucleic Protein(ENA) Antibody, IgG: <1 AI

## 2024-03-20 LAB — BETA-2 GLYCOPROTEIN ANTIBODIES
Beta-2 Glyco 1 IgA: <2 U/mL (ref <20.0)
Beta-2 Glyco 1 IgM: 4 U/mL (ref <20.0)
Beta-2 Glyco I IgG: <2 U/mL (ref <20.0)

## 2024-03-20 LAB — ANTI-SMITH ANTIBODY: ENA SM Ab Ser-aCnc: <1 AI

## 2024-03-20 LAB — C3 AND C4
C3 Complement: 170 mg/dL (ref 83–193)
C4 Complement: 25 mg/dL (ref 15–57)

## 2024-03-20 LAB — ANA: Anti Nuclear Antibody (ANA): POSITIVE — AB

## 2024-03-20 LAB — ANTI-DNA ANTIBODY, DOUBLE-STRANDED: ds DNA Ab: 2 [IU]/mL

## 2024-03-20 LAB — SJOGRENS SYNDROME-B EXTRACTABLE NUCLEAR ANTIBODY: SSB (La) (ENA) Antibody, IgG: <1 AI

## 2024-03-20 LAB — ANTI-NUCLEAR AB-TITER (ANA TITER): ANA Titer 1: 1:1280 {titer} — ABNORMAL HIGH

## 2024-03-20 LAB — SJOGRENS SYNDROME-A EXTRACTABLE NUCLEAR ANTIBODY: SSA (Ro) (ENA) Antibody, IgG: <1 AI

## 2024-03-20 LAB — ANTI-SCLERODERMA ANTIBODY: Scleroderma (Scl-70) (ENA) Antibody, IgG: <1 AI

## 2024-03-20 NOTE — Progress Notes (Signed)
All the labs are within normal limits.  I will discuss results at the follow-up visit.

## 2024-03-22 NOTE — Progress Notes (Signed)
 ANA is high titer positive, ENA panel negative, complements normal, sed rate normal, urine protein creatinine ratio normal, beta-2 GP 1 and anticardiolipin antibodies normal.  I will discuss results at the follow-up visit.

## 2024-03-26 ENCOUNTER — Telehealth: Payer: Self-pay | Admitting: *Deleted

## 2024-03-26 NOTE — Telephone Encounter (Signed)
 Patient contacted the office stating she was seen on 03/17/2024. Patient states she has not heard anything regarding lab results. Patient advised lab results will be discussed at her new patient follow up. Patient expressed understanding. Patient did want to make you aware she is having some extreme fatigue.
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
## 2024-04-14 ENCOUNTER — Encounter: Payer: Self-pay | Admitting: Rheumatology

## 2024-04-14 ENCOUNTER — Ambulatory Visit: Attending: Rheumatology | Admitting: Rheumatology

## 2024-04-14 ENCOUNTER — Ambulatory Visit: Admitting: *Deleted

## 2024-04-14 VITALS — Ht 64.0 in | Wt 167.0 lb

## 2024-04-14 VITALS — BP 120/76 | HR 75 | Temp 97.9°F | Resp 16 | Ht 64.0 in | Wt 170.0 lb

## 2024-04-14 DIAGNOSIS — K582 Mixed irritable bowel syndrome: Secondary | ICD-10-CM | POA: Diagnosis not present

## 2024-04-14 DIAGNOSIS — M722 Plantar fascial fibromatosis: Secondary | ICD-10-CM

## 2024-04-14 DIAGNOSIS — J3089 Other allergic rhinitis: Secondary | ICD-10-CM | POA: Diagnosis not present

## 2024-04-14 DIAGNOSIS — M19042 Primary osteoarthritis, left hand: Secondary | ICD-10-CM

## 2024-04-14 DIAGNOSIS — L309 Dermatitis, unspecified: Secondary | ICD-10-CM

## 2024-04-14 DIAGNOSIS — Z87891 Personal history of nicotine dependence: Secondary | ICD-10-CM

## 2024-04-14 DIAGNOSIS — K219 Gastro-esophageal reflux disease without esophagitis: Secondary | ICD-10-CM | POA: Diagnosis not present

## 2024-04-14 DIAGNOSIS — R7689 Other specified abnormal immunological findings in serum: Secondary | ICD-10-CM | POA: Diagnosis not present

## 2024-04-14 DIAGNOSIS — Z860100 Personal history of colon polyps, unspecified: Secondary | ICD-10-CM

## 2024-04-14 DIAGNOSIS — E782 Mixed hyperlipidemia: Secondary | ICD-10-CM | POA: Diagnosis not present

## 2024-04-14 DIAGNOSIS — Z981 Arthrodesis status: Secondary | ICD-10-CM

## 2024-04-14 DIAGNOSIS — M19041 Primary osteoarthritis, right hand: Secondary | ICD-10-CM | POA: Diagnosis not present

## 2024-04-14 DIAGNOSIS — J449 Chronic obstructive pulmonary disease, unspecified: Secondary | ICD-10-CM

## 2024-04-14 DIAGNOSIS — L570 Actinic keratosis: Secondary | ICD-10-CM | POA: Diagnosis not present

## 2024-04-14 DIAGNOSIS — F330 Major depressive disorder, recurrent, mild: Secondary | ICD-10-CM

## 2024-04-14 MED ORDER — NA SULFATE-K SULFATE-MG SULF 17.5-3.13-1.6 GM/177ML PO SOLN: 1.0000 | Freq: Once | ORAL | 0 refills | Status: AC

## 2024-04-14 NOTE — Progress Notes (Signed)
 Pt's name and DOB verified at the beginning of the pre-visit with 2 identifiers  Pt denies any difficulty with ambulating,sitting, laying down or rolling side to side  Pt has no issues moving head neck or swallowing  No egg or soy allergy known to patient   No issues known to pt with past sedation  No FH of Malignant Hyperthermia  Pt is not on home 02   Pt is not on blood thinners   Pt denies issues with constipation   Pt is not on dialysis  Pt denise any abnormal heart rhythms   Pt denies any upcoming cardiac testing  Patient's chart reviewed by Norleen Schillings CNRA prior to pre-visit and patient appropriate for the [facility].  Pre-visit completed and red dot placed by patient's name on their procedure day (on provider's schedule).    Chart not reviewed by CRNA prior to [facility]  Visit in person  Pt states weight is 167 lb    Pt given  both [facility] main # and MD on call # prior to instructions.  Informed pt to come in at the time discussed and is shown on PV instructions.  Pt instructed to use Singlecare.com or GoodRx for a price reduction on prep  Instructed pt where to find PV instructions in My Chart and a copy given to pt Instructed pt on all aspects of written instructions including med holds clothing to wear and foods to eat and not eat as well as after procedure legal restrictions and to call MD on call if needed.. Pt states understanding. Instructed pt to review instructions again prior to procedure and call main # given if has any questions or any issues. Pt states they will.

## 2024-04-14 NOTE — Patient Instructions (Signed)

## 2024-04-20 ENCOUNTER — Encounter: Payer: Self-pay | Admitting: Internal Medicine

## 2024-04-27 NOTE — Progress Notes (Unsigned)
 [facility] History and Physical   Primary Care Physician:  Rena Luke POUR, MD   Reason for Procedure:   Hyperplastic polyposis syndrome  Plan:    colonoscopy   The patient was provided an opportunity to ask questions and all were answered. The patient agreed with the plan.   HPI: Jacqueline Boone is a 74 y.o. female here for surveillance colonoscopy exam in the setting of hyperplastic polyposis syndrome. Last exam 01/2023.   Past Medical History:  Diagnosis Date   Allergic rhinitis    Allergy    Benign neoplasm of colon 2006   Adenomas, cigarette adenomas   Colon polyps    Condyloma 1995   COPD (chronic obstructive pulmonary disease) (HCC)    Dermatitis    Gastritis, chronic    GERD (gastroesophageal reflux disease)    H pylori ulcer    H. pylori infection    Helicobacter pylori gastritis    eradicated   Hyperplastic polyposis syndrome and hx adenomas 12/26/2009   Hx large ceccal and other right sided- hyperplastic polyps and at least one adenoma since 2005 08/2013 - 5 polyps  3 hyperplastic at splenic flexure and 2 in sigmoid - 06/20/2021 22 polyps removed max 2 cm - diminutive polyps remain 20 were hyperplastic      IBS (irritable bowel syndrome)    Ledderhose's disease    PUD (peptic ulcer disease)    Wrist fracture    left    Past Surgical History:  Procedure Laterality Date   CERVICAL DISCECTOMY  1996   C4,C5   COLONOSCOPY  Multiple   last 08/18/13 Lateria Alderman-polyps   ESOPHAGOGASTRODUODENOSCOPY  Multiple   POLYPECTOMY     TOTAL ABDOMINAL HYSTERECTOMY W/ BILATERAL SALPINGOOPHORECTOMY  1974   with BSO     Current Outpatient Medications  Medication Sig Dispense Refill   fluticasone  (FLONASE ) 50 MCG/ACT nasal spray Place 1 spray into both nostrils daily. (Patient taking differently: Place 1 spray into both nostrils as needed.) 16 g 5   fluticasone  furoate-vilanterol (BREO ELLIPTA) 100-25 MCG/ACT AEPB Inhale 1 puff into the lungs daily as needed.      ibuprofen (ADVIL) 200 MG tablet Take 200 mg by mouth every 6 (six) hours as needed. 2 as needed for pain     nystatin -triamcinolone  ointment (MYCOLOG) Apply 1 application. topically 2 (two) times daily. (Patient taking differently: Apply 1 application  topically as needed.) 30 g 0   omeprazole  (PRILOSEC) 40 MG capsule TAKE 1 CAPSULE(40 MG) BY MOUTH DAILY (Patient taking differently: as needed.) 30 capsule 4   triamcinolone  cream (KENALOG) 0.1 % Apply 1 application topically as needed.     No current facility-administered medications for this visit.    Allergies as of 04/28/2024   (No Known Allergies)    Family History  Problem Relation Age of Onset   Emphysema Mother    Hodgkin's lymphoma Father    Emphysema Father    Glaucoma Brother        blind   Mental retardation Brother    Colon cancer Neg Hx    Pancreatic cancer Neg Hx    Rectal cancer Neg Hx    Stomach cancer Neg Hx    Colon polyps Neg Hx    Esophageal cancer Neg Hx    Breast cancer Neg Hx     Social History   Socioeconomic History   Marital status: Married    Spouse name: Not on file   Number of children: 0   Years of education: Not  on file   Highest education level: Not on file  Occupational History    Employer: UNEMPLOYED  Tobacco Use   Smoking status: Former    Current packs/day: 0.00    Average packs/day: 1 pack/day for 50.0 years (50.0 ttl pk-yrs)    Types: Cigarettes    Start date: 12/10/1958    Quit date: 12/09/2008    Years since quitting: 15.3    Passive exposure: Never   Smokeless tobacco: Never  Vaping Use   Vaping status: Never Used  Substance and Sexual Activity   Alcohol use: Yes    Comment: occ   Drug use: No   Sexual activity: Yes    Birth control/protection: Surgical    Comment: hysterectomy  Other Topics Concern   Not on file  Social History Narrative   Married   no kids   Patient is a former smoker.     Alcohol Use - yes   Daily Caffeine Use   Works part-time at [employer]            Social Drivers of Health   Tobacco Use: Medium Risk (04/14/2024)   Patient History    Smoking Tobacco Use: Former    Smokeless Tobacco Use: Never    Passive Exposure: Never  Physicist, Medical Strain: Patient Declined (09/16/2023)   Received from [facility]   Overall Financial Resource Strain (CARDIA)    Difficulty of Paying Living Expenses: Patient declined  Food Insecurity: Patient Declined (09/16/2023)   Received from [facility]   Epic    Within the past 12 months, you worried that your food would run out before you got the money to buy more.: Patient declined    Within the past 12 months, the food you bought just didn't last and you didn't have money to get more.: Patient declined  Transportation Needs: Patient Declined (09/16/2023)   Received from [facility] - Transportation    Lack of Transportation (Medical): Patient declined    Lack of Transportation (Non-Medical): Patient declined  Physical Activity: Unknown (09/16/2023)   Received from [facility]   Exercise Vital Sign    On average, how many days per week do you engage in moderate to strenuous exercise (like a brisk walk)?: Patient declined    On average, how many minutes do you engage in exercise at this level?: 20 min  Stress: Patient Declined (09/16/2023)   Received from [facility] - Occupational Stress Questionnaire    Feeling of Stress : Patient declined  Social Connections: Patient Declined (09/16/2023)   Received from [facility]   Social Network    How would you rate your social network (family, work, friends)?: Patient declined  Intimate Partner Violence: Unknown (09/16/2023)   Received from [facility]   HITS    Physically Hurt: Not on file    Over the last 12 months how often did your partner insult you or talk down to you?: Patient declined    Over the last 12 months how often did your partner threaten you with physical harm?:  Patient declined    Over the last 12 months how often did your partner scream or curse at you?: Patient declined  Depression (EYV7-0): Not on file  Alcohol Screen: Not on file  Housing: Patient Declined (09/16/2023)   Received from [facility]    In the last 12 months, was there a time when you were not able to  pay the mortgage or rent on time?: Patient declined    In the past 12 months, how many times have you moved where you were living?: 0    At any time in the past 12 months, were you homeless or living in a shelter (including now)?: Patient declined  Utilities: Patient Declined (09/16/2023)   Received from [facility] Utilities    Threatened with loss of utilities: Patient declined  Health Literacy: Not on file    Review of Systems: Positive for *** All other review of systems negative except as mentioned in the HPI.  Physical Exam: Vital signs LMP 04/09/1972   General:   Alert,  Well-developed, well-nourished, pleasant and cooperative in NAD Lungs:  Clear throughout to auscultation.   Heart:  Regular rate and rhythm; no murmurs, clicks, rubs,  or gallops. Abdomen:  Soft, nontender and nondistended. Normal bowel sounds.   Neuro/Psych:  Alert and cooperative. Normal mood and affect. A and O x 3   @Darryle Dennie  CHARLENA Commander, MD, [facility] [phone] (pager) 04/27/2024 8:07 PM@

## 2024-04-28 ENCOUNTER — Ambulatory Visit: Admitting: Internal Medicine

## 2024-04-28 ENCOUNTER — Encounter: Payer: Self-pay | Admitting: Internal Medicine

## 2024-04-28 VITALS — BP 125/58 | HR 93 | Temp 97.0°F | Resp 18 | Ht 64.0 in | Wt 167.0 lb

## 2024-04-28 DIAGNOSIS — Z860100 Personal history of colon polyps, unspecified: Secondary | ICD-10-CM

## 2024-04-28 DIAGNOSIS — D126 Benign neoplasm of colon, unspecified: Secondary | ICD-10-CM

## 2024-04-28 DIAGNOSIS — Z860102 Personal history of hyperplastic colon polyps: Secondary | ICD-10-CM

## 2024-04-28 DIAGNOSIS — K621 Rectal polyp: Secondary | ICD-10-CM

## 2024-04-28 DIAGNOSIS — K623 Rectal prolapse: Secondary | ICD-10-CM | POA: Diagnosis not present

## 2024-04-28 DIAGNOSIS — Z860101 Personal history of adenomatous and serrated colon polyps: Secondary | ICD-10-CM

## 2024-04-28 DIAGNOSIS — Z1211 Encounter for screening for malignant neoplasm of colon: Secondary | ICD-10-CM | POA: Diagnosis present

## 2024-04-28 DIAGNOSIS — K635 Polyp of colon: Secondary | ICD-10-CM | POA: Diagnosis not present

## 2024-04-28 DIAGNOSIS — D128 Benign neoplasm of rectum: Secondary | ICD-10-CM

## 2024-04-28 DIAGNOSIS — D124 Benign neoplasm of descending colon: Secondary | ICD-10-CM

## 2024-04-28 MED ORDER — SODIUM CHLORIDE 0.9 % IV SOLN: 500.0000 mL | Freq: Once | INTRAVENOUS | Status: AC

## 2024-04-28 NOTE — Patient Instructions (Addendum)
 I removed 3 polyps today.  They look benign as in the past.  I will let you know those pathology results and when to repeat an exam.  YOU HAD AN ENDOSCOPIC PROCEDURE TODAY AT THE [facility]:   Refer to the procedure report that was given to you for any specific questions about what was found during the examination.  If the procedure report does not answer your questions, please call your gastroenterologist to clarify.  If you requested that your care partner not be given the details of your procedure findings, then the procedure report has been included in a sealed envelope for you to review at your convenience later.  YOU SHOULD EXPECT: Some feelings of bloating in the abdomen. Passage of more gas than usual.  Walking can help get rid of the air that was put into your GI tract during the procedure and reduce the bloating. If you had a lower endoscopy (such as a colonoscopy or flexible sigmoidoscopy) you may notice spotting of blood in your stool or on the toilet paper. If you underwent a bowel prep for your procedure, you may not have a normal bowel movement for a few days.  Please Note:  You might notice some irritation and congestion in your nose or some drainage.  This is from the oxygen used during your procedure.  There is no need for concern and it should clear up in a day or so.  SYMPTOMS TO REPORT IMMEDIATELY:  Following lower endoscopy (colonoscopy or flexible sigmoidoscopy):  Excessive amounts of blood in the stool  Significant tenderness or worsening of abdominal pains  Swelling of the abdomen that is new, acute  Fever of 100F or higher  For urgent or emergent issues, a gastroenterologist can be reached at any hour by calling [phone]. Do not use MyChart messaging for urgent concerns.    DIET:  We do recommend a small meal at first, but then you may proceed to your regular diet.  Drink plenty of fluids but you should avoid alcoholic beverages for 24  hours.  ACTIVITY:  You should plan to take it easy for the rest of today and you should NOT DRIVE or use heavy machinery until tomorrow (because of the sedation medicines used during the test).    FOLLOW UP: Our staff will call the number listed on your records the next business day following your procedure.  We will call around 7:15- 8:00 am to check on you and address any questions or concerns that you may have regarding the information given to you following your procedure. If we do not reach you, we will leave a message.     If any biopsies were taken you will be contacted by phone or by letter within the next 1-3 weeks.  Please call us  at [phone] if you have not heard about the biopsies in 3 weeks.    SIGNATURES/CONFIDENTIALITY: You and/or your care partner have signed paperwork which will be entered into your electronic medical record.  These signatures attest to the fact that that the information above on your After Visit Summary has been reviewed and is understood.  Full responsibility of the confidentiality of this discharge information lies with you and/or your care-partner.

## 2024-04-28 NOTE — Op Note (Signed)
 [facility] Patient Name: Jacqueline Boone Procedure Date: 04/28/2024 7:34 AM MRN: [id number] Endoscopist: Lupita FORBES Commander , MD, [id number] Age: 74 Referring MD:  Date of Birth: 11-29-1950 Gender: Female Account #: [id number] Procedure:                Colonoscopy Indications:              High risk colon cancer surveillance: Personal                            history of colonic polyps and hyperplastic                            polyposis syndrome, Last colonoscopy: October 2024                           Hx large ceccal and other right sided- hyperplastic                            polyps and at least one adenoma since 2005 08/2013 -                            5 polyps 3 hyperplastic at splenic flexure and 2 in                            sigmoid - 06/20/2021 22 polyps removed max 2 cm -                            diminutive polyps remain 20 were hyperplastic                           01/31/23 - Fifteen (15) 2 to 8 mm polyps 12 were                            recovered. Many diminutive polyps in the rectum and                            sigmoid colon. 4 adenomas, 1 ssp, 7 hpp recall 1                            year Medicines:                Monitored Anesthesia Care Procedure:                Pre-Anesthesia Assessment:                           - Prior to the procedure, a History and Physical                            was performed, and patient medications and                            allergies were reviewed. The patient's tolerance of  previous anesthesia was also reviewed. The risks                            and benefits of the procedure and the sedation                            options and risks were discussed with the patient.                            All questions were answered, and informed consent                            was obtained. Prior Anticoagulants: The patient has                            taken no anticoagulant or  antiplatelet agents. ASA                            Grade Assessment: II - A patient with mild systemic                            disease. After reviewing the risks and benefits,                            the patient was deemed in satisfactory condition to                            undergo the procedure.                           After obtaining informed consent, the colonoscope                            was passed under direct vision. Throughout the                            procedure, the patient's blood pressure, pulse, and                            oxygen saturations were monitored continuously. The                            Olympus Scope [id number] was introduced through the                            anus and advanced to the the cecum, identified by                            appendiceal orifice and ileocecal valve. The                            colonoscopy was performed without difficulty. The  patient tolerated the procedure well. The quality                            of the bowel preparation was good. The ileocecal                            valve, appendiceal orifice, and rectum were                            photographed. The bowel preparation used was SUPREP                            via split dose instruction. Scope In: 8:08:40 AM Scope Out: 8:32:12 AM Scope Withdrawal Time: 0 hours 18 minutes 32 seconds  Total Procedure Duration: 0 hours 23 minutes 32 seconds  Findings:                 The perianal and digital rectal examinations were                            normal.                           Three sessile polyps were found in the proximal                            rectum and descending colon. The polyps were 5 to                            15 mm in size. These polyps were removed with a                            cold snare. Resection and retrieval were complete.                            Verification of patient identification for  the                            specimen was done. Estimated blood loss was                            minimal. There were other diminutive polyps in the                            sigmoid and rectum1-2 mm, left behind. Complications:            No immediate complications. Estimated Blood Loss:     Estimated blood loss was minimal. Impression:               - Three 5 to 15 mm polyps in the proximal rectum                            and in the descending colon, removed with a cold  snare. Resected and retrieved.                           - Personal history of colonic polyps. As above -                            hyperplastic polyposis syndrome Recommendation:           - Patient has a contact number available for                            emergencies. The signs and symptoms of potential                            delayed complications were discussed with the                            patient. Return to normal activities tomorrow.                            Written discharge instructions were provided to the                            patient.                           - Resume previous diet.                           - Continue present medications.                           - Await pathology results.                           - Repeat colonoscopy is recommended for                            surveillance. The colonoscopy date will be                            determined after pathology results from today's                            exam become available for review. Lupita FORBES Commander, MD 04/28/2024 8:46:04 AM This report has been signed electronically.

## 2024-04-28 NOTE — Progress Notes (Signed)
 Pt's states no medical or surgical changes since previsit or office visit.

## 2024-04-28 NOTE — Progress Notes (Signed)
 Called to room to assist during endoscopic procedure.  Patient ID and intended procedure confirmed with present staff. Received instructions for my participation in the procedure from the performing physician.

## 2024-04-28 NOTE — Progress Notes (Signed)
 Sedate, gd SR, tolerated procedure well, VSS, report to RN

## 2024-04-29 ENCOUNTER — Telehealth: Payer: Self-pay | Admitting: *Deleted

## 2024-04-29 NOTE — Telephone Encounter (Signed)
" °  Follow up Call-     04/28/2024    7:12 AM 01/31/2023    1:12 PM  Call back number  Post procedure Call Back phone  # [phone]  Permission to leave phone message Yes Yes     Patient questions:  Do you have a fever, pain , or abdominal swelling? No. Pain Score  0 *  Have you tolerated food without any problems? Yes.    Have you been able to return to your normal activities? Yes.    Do you have any questions about your discharge instructions: Diet   No. Medications  No. Follow up visit  No.  Do you have questions or concerns about your Care? No.  Actions: * If pain score is 4 or above: No action needed, pain <4.   "

## 2024-04-30 LAB — SURGICAL PATHOLOGY

## 2024-05-12 ENCOUNTER — Ambulatory Visit: Payer: Self-pay | Admitting: Internal Medicine
